# Patient Record
Sex: Female | Born: 1960
Health system: Southern US, Community
[De-identification: ages and names within clinical notes are randomized; demographics above are authoritative.]

## PROBLEM LIST (undated history)

## (undated) DIAGNOSIS — E079 Disorder of thyroid, unspecified: Secondary | ICD-10-CM

## (undated) DIAGNOSIS — K5792 Diverticulitis of intestine, part unspecified, without perforation or abscess without bleeding: Secondary | ICD-10-CM

## (undated) DIAGNOSIS — I10 Essential (primary) hypertension: Secondary | ICD-10-CM

## (undated) HISTORY — PX: APPENDECTOMY: SHX54

## (undated) HISTORY — PX: SMALL INTESTINE SURGERY: SHX150

## (undated) HISTORY — PX: NOSE SURGERY: SHX723

## (undated) HISTORY — PX: BREAST SURGERY: SHX581

## (undated) HISTORY — PX: TUBAL LIGATION: SHX77

---

## 2008-03-13 ENCOUNTER — Emergency Department (HOSPITAL_COMMUNITY): Admission: EM | Admit: 2008-03-13 | Discharge: 2008-03-13 | Payer: Self-pay | Admitting: Emergency Medicine

## 2008-12-30 ENCOUNTER — Emergency Department (HOSPITAL_COMMUNITY): Admission: EM | Admit: 2008-12-30 | Discharge: 2008-12-30 | Payer: Self-pay | Admitting: Emergency Medicine

## 2009-06-05 ENCOUNTER — Encounter: Admission: RE | Admit: 2009-06-05 | Discharge: 2009-06-05 | Payer: Self-pay | Admitting: Family Medicine

## 2009-10-27 ENCOUNTER — Emergency Department (HOSPITAL_COMMUNITY): Admission: EM | Admit: 2009-10-27 | Discharge: 2009-10-27 | Payer: Self-pay | Admitting: Family Medicine

## 2010-10-09 ENCOUNTER — Encounter: Admission: RE | Admit: 2010-10-09 | Discharge: 2010-10-09 | Payer: Self-pay | Admitting: Obstetrics and Gynecology

## 2011-03-25 LAB — URINALYSIS, ROUTINE W REFLEX MICROSCOPIC
Bilirubin Urine: NEGATIVE
Glucose, UA: NEGATIVE mg/dL
Protein, ur: NEGATIVE mg/dL
Urobilinogen, UA: 0.2 mg/dL (ref 0.0–1.0)

## 2011-03-25 LAB — URINE MICROSCOPIC-ADD ON

## 2011-08-19 ENCOUNTER — Emergency Department (HOSPITAL_COMMUNITY): Payer: BC Managed Care – PPO

## 2011-08-19 ENCOUNTER — Emergency Department (HOSPITAL_COMMUNITY)
Admission: EM | Admit: 2011-08-19 | Discharge: 2011-08-19 | Disposition: A | Discharge status: Home / Self Care | Payer: BC Managed Care – PPO | Attending: Emergency Medicine | Admitting: Emergency Medicine

## 2011-08-19 DIAGNOSIS — I10 Essential (primary) hypertension: Secondary | ICD-10-CM | POA: Insufficient documentation

## 2011-08-19 DIAGNOSIS — Z79899 Other long term (current) drug therapy: Secondary | ICD-10-CM | POA: Insufficient documentation

## 2011-08-19 DIAGNOSIS — E039 Hypothyroidism, unspecified: Secondary | ICD-10-CM | POA: Insufficient documentation

## 2011-08-19 DIAGNOSIS — R109 Unspecified abdominal pain: Secondary | ICD-10-CM | POA: Insufficient documentation

## 2011-08-19 LAB — URINALYSIS, ROUTINE W REFLEX MICROSCOPIC
Bilirubin Urine: NEGATIVE
Glucose, UA: NEGATIVE mg/dL
Leukocytes, UA: NEGATIVE
Nitrite: NEGATIVE
Specific Gravity, Urine: 1.007 (ref 1.005–1.030)
pH: 6.5 (ref 5.0–8.0)

## 2011-08-19 LAB — URINE MICROSCOPIC-ADD ON

## 2011-08-19 LAB — LIPASE, BLOOD: Lipase: 25 U/L (ref 11–59)

## 2011-09-02 ENCOUNTER — Other Ambulatory Visit: Payer: Self-pay | Admitting: Obstetrics and Gynecology

## 2011-09-02 DIAGNOSIS — Z1231 Encounter for screening mammogram for malignant neoplasm of breast: Secondary | ICD-10-CM

## 2011-09-03 LAB — CBC
HCT: 41.1
Hemoglobin: 14
MCHC: 34
MCV: 86.4
RDW: 13.3

## 2011-09-03 LAB — COMPREHENSIVE METABOLIC PANEL
Alkaline Phosphatase: 70
BUN: 17
Calcium: 9.6
Creatinine, Ser: 0.68
Glucose, Bld: 101 — ABNORMAL HIGH
Total Protein: 7.3

## 2011-09-03 LAB — DIFFERENTIAL
Basophils Relative: 0
Lymphocytes Relative: 19
Lymphs Abs: 1.9
Monocytes Relative: 6
Neutro Abs: 7.2
Neutrophils Relative %: 72

## 2011-09-03 LAB — URINALYSIS, ROUTINE W REFLEX MICROSCOPIC
Bilirubin Urine: NEGATIVE
Nitrite: NEGATIVE
Protein, ur: NEGATIVE
Urobilinogen, UA: 0.2

## 2011-10-11 ENCOUNTER — Ambulatory Visit
Admission: RE | Admit: 2011-10-11 | Discharge: 2011-10-11 | Disposition: A | Discharge status: Home / Self Care | Payer: BC Managed Care – PPO | Source: Ambulatory Visit | Attending: Obstetrics and Gynecology | Admitting: Obstetrics and Gynecology

## 2011-10-11 DIAGNOSIS — Z1231 Encounter for screening mammogram for malignant neoplasm of breast: Secondary | ICD-10-CM

## 2012-05-30 ENCOUNTER — Emergency Department (HOSPITAL_COMMUNITY)
Admission: EM | Admit: 2012-05-30 | Discharge: 2012-05-30 | Disposition: A | Discharge status: Home / Self Care | Payer: BC Managed Care – PPO | Attending: Emergency Medicine | Admitting: Emergency Medicine

## 2012-05-30 ENCOUNTER — Emergency Department (HOSPITAL_COMMUNITY): Payer: BC Managed Care – PPO

## 2012-05-30 ENCOUNTER — Encounter (HOSPITAL_COMMUNITY): Payer: Self-pay | Admitting: Emergency Medicine

## 2012-05-30 DIAGNOSIS — R61 Generalized hyperhidrosis: Secondary | ICD-10-CM | POA: Insufficient documentation

## 2012-05-30 DIAGNOSIS — K5792 Diverticulitis of intestine, part unspecified, without perforation or abscess without bleeding: Secondary | ICD-10-CM

## 2012-05-30 DIAGNOSIS — R110 Nausea: Secondary | ICD-10-CM | POA: Insufficient documentation

## 2012-05-30 DIAGNOSIS — R1032 Left lower quadrant pain: Secondary | ICD-10-CM | POA: Insufficient documentation

## 2012-05-30 DIAGNOSIS — E079 Disorder of thyroid, unspecified: Secondary | ICD-10-CM | POA: Insufficient documentation

## 2012-05-30 DIAGNOSIS — K5732 Diverticulitis of large intestine without perforation or abscess without bleeding: Secondary | ICD-10-CM | POA: Insufficient documentation

## 2012-05-30 DIAGNOSIS — Z79899 Other long term (current) drug therapy: Secondary | ICD-10-CM | POA: Insufficient documentation

## 2012-05-30 DIAGNOSIS — I10 Essential (primary) hypertension: Secondary | ICD-10-CM | POA: Insufficient documentation

## 2012-05-30 HISTORY — DX: Disorder of thyroid, unspecified: E07.9

## 2012-05-30 HISTORY — DX: Essential (primary) hypertension: I10

## 2012-05-30 HISTORY — DX: Diverticulitis of intestine, part unspecified, without perforation or abscess without bleeding: K57.92

## 2012-05-30 LAB — URINALYSIS, ROUTINE W REFLEX MICROSCOPIC
Bilirubin Urine: NEGATIVE
Glucose, UA: NEGATIVE mg/dL
Ketones, ur: NEGATIVE mg/dL
Leukocytes, UA: NEGATIVE
Nitrite: NEGATIVE
Protein, ur: NEGATIVE mg/dL
Specific Gravity, Urine: 1.007 (ref 1.005–1.030)
Urobilinogen, UA: 0.2 mg/dL (ref 0.0–1.0)
pH: 6 (ref 5.0–8.0)

## 2012-05-30 LAB — COMPREHENSIVE METABOLIC PANEL
ALT: 11 U/L (ref 0–35)
AST: 14 U/L (ref 0–37)
Albumin: 3.8 g/dL (ref 3.5–5.2)
Alkaline Phosphatase: 89 U/L (ref 39–117)
BUN: 24 mg/dL — ABNORMAL HIGH (ref 6–23)
CO2: 22 mEq/L (ref 19–32)
Calcium: 9.6 mg/dL (ref 8.4–10.5)
Chloride: 103 mEq/L (ref 96–112)
Creatinine, Ser: 0.72 mg/dL (ref 0.50–1.10)
GFR calc Af Amer: >90 mL/min (ref >90)
GFR calc non Af Amer: >90 mL/min (ref >90)
Glucose, Bld: 96 mg/dL (ref 70–99)
Potassium: 3.4 mEq/L — ABNORMAL LOW (ref 3.5–5.1)
Sodium: 138 mEq/L (ref 135–145)
Total Bilirubin: 0.4 mg/dL (ref 0.3–1.2)
Total Protein: 7.7 g/dL (ref 6.0–8.3)

## 2012-05-30 LAB — CBC
HCT: 40.6 % (ref 36.0–46.0)
Hemoglobin: 13.7 g/dL (ref 12.0–15.0)
MCH: 30 pg (ref 26.0–34.0)
MCHC: 33.7 g/dL (ref 30.0–36.0)
MCV: 88.8 fL (ref 78.0–100.0)
Platelets: 206 10*3/uL (ref 150–400)
RBC: 4.57 MIL/uL (ref 3.87–5.11)
RDW: 13.5 % (ref 11.5–15.5)
WBC: 12.2 10*3/uL — ABNORMAL HIGH (ref 4.0–10.5)

## 2012-05-30 LAB — DIFFERENTIAL
Basophils Absolute: 0.1 10*3/uL (ref 0.0–0.1)
Basophils Relative: 0 % (ref 0–1)
Eosinophils Absolute: 0.4 10*3/uL (ref 0.0–0.7)
Eosinophils Relative: 3 % (ref 0–5)
Lymphocytes Relative: 13 % (ref 12–46)
Lymphs Abs: 1.6 10*3/uL (ref 0.7–4.0)
Monocytes Absolute: 0.4 10*3/uL (ref 0.1–1.0)
Monocytes Relative: 3 % (ref 3–12)
Neutro Abs: 9.7 10*3/uL — ABNORMAL HIGH (ref 1.7–7.7)
Neutrophils Relative %: 80 % — ABNORMAL HIGH (ref 43–77)

## 2012-05-30 LAB — URINE MICROSCOPIC-ADD ON

## 2012-05-30 MED ORDER — OXYCODONE-ACETAMINOPHEN 5-325 MG PO TABS: 1.0000 | ORAL_TABLET | Freq: Four times a day (QID) | ORAL | Status: DC | PRN

## 2012-05-30 MED ORDER — PROMETHAZINE HCL 25 MG PO TABS: 25.0000 mg | ORAL_TABLET | Freq: Three times a day (TID) | ORAL | Status: DC | PRN

## 2012-05-30 MED ORDER — MOXIFLOXACIN HCL 400 MG PO TABS: 400.0000 mg | ORAL_TABLET | Freq: Every day | ORAL | Status: DC

## 2012-05-30 MED ORDER — MOXIFLOXACIN HCL IN NACL 400 MG/250ML IV SOLN
400.0000 mg | Freq: Once | INTRAVENOUS | Status: AC
  Administered 2012-05-30: 400 mg via INTRAVENOUS
  Filled 2012-05-30: qty 250

## 2012-05-30 MED ORDER — KETOROLAC TROMETHAMINE 30 MG/ML IJ SOLN
30.0000 mg | Freq: Once | INTRAMUSCULAR | Status: AC
  Administered 2012-05-30: 30 mg via INTRAVENOUS
  Filled 2012-05-30: qty 1

## 2012-05-30 NOTE — Discharge Instructions (Signed)
Return here to the emergency department for any worsening in your condition.  Followup with your primary care doctor Monday for recheck.

## 2012-05-30 NOTE — ED Notes (Signed)
Pt started on barium PO contrast- 1 cup @0905  2nd cup @1005 , scan planned for 1105

## 2012-05-30 NOTE — ED Notes (Signed)
Pt reports left lower quadrant abdominal pain onset Wednesday at 0300. Pt reports nausea, diaphoresis and pale stools.

## 2012-05-30 NOTE — ED Provider Notes (Signed)
Medical screening examination/treatment/procedure(s) were conducted as a shared visit with non-physician practitioner(s) and myself.  I personally evaluated the patient during the encounter 51 year old woman with prior history of diverticulitis has developed LLQ pain, has elevated WBC and CT of abdomen/pelvis showing diverticulitis.   Carleene Cooper III, MD 05/30/12 [phone]

## 2012-05-30 NOTE — ED Notes (Signed)
Pt remains in CT

## 2012-05-30 NOTE — ED Provider Notes (Signed)
History     CSN: [id number]  Arrival date & time 05/30/12  0736   First MD Initiated Contact with Patient 05/30/12 [phone]      Chief Complaint  Patient presents with  . Abdominal Pain    (Consider location/radiation/quality/duration/timing/severity/associated sxs/prior treatment) HPI Patient presents emergency department with left lower quadrant abdominal pain that started Wednesday.  Patient, states, that she has had a history of diverticulitis with one occasion where she had rupture of the abscess.  At that time she had bowel resection and repair.  Patient, states, that she has had nausea, and sweats, but no documented fever.  Patient, states she has not had any vomiting, diarrhea, chest pain, shortness of breath, weakness, dizziness, or back pain.  Patient, states that colonoscopy 3 months ago.  Patient, states that she took ibuprofen prior to arrival with some relief of her pain.  Past Medical History  Diagnosis Date  . Hypertension   . Thyroid disease   . Diverticulitis     Past Surgical History  Procedure Date  . Small intestine surgery   . Breast surgery   . Tubal ligation   . Nose surgery   . Appendectomy     History reviewed. No pertinent family history.  History  Substance Use Topics  . Smoking status: Never Smoker   . Smokeless tobacco: Not on file  . Alcohol Use: No    OB History    Grav Para Term Preterm Abortions TAB SAB Ect Mult Living                  Review of Systems All other systems negative except as documented in the HPI. All pertinent positives and negatives as reviewed in the HPI.  Allergies  Ivp dye  Home Medications   Current Outpatient Rx  Name Route Sig Dispense Refill  . IBUPROFEN 200 MG PO TABS Oral Take 400 mg by mouth every 6 (six) hours as needed. For pain    . KETOCON EX Apply externally Apply 1 application topically daily. For facial eczema    . LEVOTHYROXINE SODIUM 100 MCG PO TABS Oral Take 100 mcg by mouth daily.    Marland Kitchen  LOSARTAN POTASSIUM-HCTZ 50-12.5 MG PO TABS Oral Take 1 tablet by mouth daily.    . ADULT MULTIVITAMIN W/MINERALS CH Oral Take 1 tablet by mouth daily.    Marland Kitchen FISH OIL PO Oral Take 1 capsule by mouth daily.    . SULFACETAMIDE SOD-SULFUR WASH EX Apply externally Apply 1 application topically daily. For facial eczema      BP 114/73  Pulse 78  Temp 98 F (36.7 C) (Oral)  Ht 5\' 4"  (1.626 m)  Wt 165 lb (74.844 kg)  BMI 28.32 kg/m2  SpO2 95%  Physical Exam  Constitutional: She appears well-developed and well-nourished. No distress.  HENT:  Head: Normocephalic and atraumatic.  Mouth/Throat: Oropharynx is clear and moist.  Cardiovascular: Normal rate, regular rhythm and normal heart sounds.  Exam reveals no gallop and no friction rub.   No murmur heard. Pulmonary/Chest: Effort normal and breath sounds normal. No respiratory distress.  Abdominal: Soft. Bowel sounds are normal. She exhibits no distension. There is tenderness in the left lower quadrant. There is no rebound and no guarding.    Skin: Skin is warm and dry. No rash noted.    ED Course  Procedures (including critical care time)  Labs Reviewed  CBC - Abnormal; Notable for the following:    WBC 12.2 (*)  All other components within normal limits  DIFFERENTIAL - Abnormal; Notable for the following:    Neutrophils Relative 80 (*)     Neutro Abs 9.7 (*)     All other components within normal limits  COMPREHENSIVE METABOLIC PANEL - Abnormal; Notable for the following:    Potassium 3.4 (*)     BUN 24 (*)     All other components within normal limits  URINALYSIS, ROUTINE W REFLEX MICROSCOPIC - Abnormal; Notable for the following:    Hgb urine dipstick TRACE (*)     All other components within normal limits  URINE MICROSCOPIC-ADD ON   Ct Abdomen Pelvis Wo Contrast  05/30/2012  *RADIOLOGY REPORT*  Clinical Data: Abdominal pain.  CT ABDOMEN AND PELVIS WITHOUT CONTRAST  Technique:  Multidetector CT imaging of the abdomen and  pelvis was performed following the standard protocol without intravenous contrast.  Comparison: Abdominal CT 08/19/2011  Findings: The lung bases are clear.  There is no evidence for free intraperitoneal air.  Evaluation of the solid intra-abdominal organs is limited due the lack of intravenous contrast.  No gross abnormality to the liver, spleen, gallbladder, pancreas or adrenal glands.  There is a 3 mm stone in the right kidney upper pole without obstruction.  Normal appearance of both kidneys.  Evidence for tubal ligation clips bilaterally.  No gross abnormality to the uterus or adnexa tissue.  Focal wall thickening with surrounding inflammatory changes in the lower descending colon.  There are colonic diverticula in this region.  Findings are suspicious for acute diverticulitis.  There is a trace amount of fluid in this area but no evidence for a drainable fluid or abscess collection.  Mild amount of stool in the right colon.  Again noted are surgical clips in the lower abdomen and pelvis.  Degenerative disc disease in the lower lumbar spine. No acute bony abnormality.  IMPRESSION: Wall thickening and surrounding inflammatory changes in the lower left colon.  Findings are suggestive for acute diverticulitis. There is a trace amount of adjacent fluid but no evidence for an abscess collection.  Nonobstructive right kidney stone.  Original Report Authenticated By: Richarda Overlie, M.D.     Patient be treated for diverticulitis.  Patient, states she would like to go home on oral antibiotics and pain medications if possible.  Patient is, in stable here in the emergency department without any complicated factors.  Her vital signs remained stable as well.  Patient is given Avelox here in the emergency department for her diverticulitis.  Patient has not requested any pain medications because she is driving and does not have a ride.  Patient has been advised to the lab testing and CT scan result.  Recent voices an  understanding of our plan and is advised to return here for any worsening in her condition.  She is also advised to followup with her primary care doctor Monday. MDM          Carlyle Dolly, PA-C 05/30/12 1300

## 2012-06-08 ENCOUNTER — Emergency Department (HOSPITAL_COMMUNITY)
Admission: EM | Admit: 2012-06-08 | Discharge: 2012-06-09 | Disposition: A | Discharge status: Home / Self Care | Payer: BC Managed Care – PPO | Attending: Emergency Medicine | Admitting: Emergency Medicine

## 2012-06-08 ENCOUNTER — Encounter (HOSPITAL_COMMUNITY): Payer: Self-pay | Admitting: *Deleted

## 2012-06-08 ENCOUNTER — Emergency Department (HOSPITAL_COMMUNITY): Payer: BC Managed Care – PPO

## 2012-06-08 DIAGNOSIS — R6883 Chills (without fever): Secondary | ICD-10-CM | POA: Insufficient documentation

## 2012-06-08 DIAGNOSIS — M791 Myalgia, unspecified site: Secondary | ICD-10-CM

## 2012-06-08 DIAGNOSIS — R5383 Other fatigue: Secondary | ICD-10-CM | POA: Insufficient documentation

## 2012-06-08 DIAGNOSIS — B349 Viral infection, unspecified: Secondary | ICD-10-CM

## 2012-06-08 DIAGNOSIS — R5381 Other malaise: Secondary | ICD-10-CM | POA: Insufficient documentation

## 2012-06-08 DIAGNOSIS — I10 Essential (primary) hypertension: Secondary | ICD-10-CM | POA: Insufficient documentation

## 2012-06-08 DIAGNOSIS — R110 Nausea: Secondary | ICD-10-CM | POA: Insufficient documentation

## 2012-06-08 DIAGNOSIS — R109 Unspecified abdominal pain: Secondary | ICD-10-CM | POA: Insufficient documentation

## 2012-06-08 DIAGNOSIS — IMO0001 Reserved for inherently not codable concepts without codable children: Secondary | ICD-10-CM | POA: Insufficient documentation

## 2012-06-08 DIAGNOSIS — Z79899 Other long term (current) drug therapy: Secondary | ICD-10-CM | POA: Insufficient documentation

## 2012-06-08 DIAGNOSIS — E079 Disorder of thyroid, unspecified: Secondary | ICD-10-CM | POA: Insufficient documentation

## 2012-06-08 DIAGNOSIS — B9789 Other viral agents as the cause of diseases classified elsewhere: Secondary | ICD-10-CM | POA: Insufficient documentation

## 2012-06-08 LAB — COMPREHENSIVE METABOLIC PANEL
CO2: 27 mEq/L (ref 19–32)
Calcium: 10.1 mg/dL (ref 8.4–10.5)
Creatinine, Ser: 1.03 mg/dL (ref 0.50–1.10)
GFR calc Af Amer: 71 mL/min — ABNORMAL LOW (ref >90)
GFR calc non Af Amer: 61 mL/min — ABNORMAL LOW (ref >90)
Glucose, Bld: 110 mg/dL — ABNORMAL HIGH (ref 70–99)
Total Protein: 8.3 g/dL (ref 6.0–8.3)

## 2012-06-08 LAB — CBC WITH DIFFERENTIAL/PLATELET
Basophils Absolute: 0 10*3/uL (ref 0.0–0.1)
Eosinophils Absolute: 0.5 10*3/uL (ref 0.0–0.7)
Eosinophils Relative: 6 % — ABNORMAL HIGH (ref 0–5)
HCT: 42.6 % (ref 36.0–46.0)
Lymphocytes Relative: 9 % — ABNORMAL LOW (ref 12–46)
Lymphs Abs: 0.9 10*3/uL (ref 0.7–4.0)
MCH: 30.1 pg (ref 26.0–34.0)
MCV: 86.8 fL (ref 78.0–100.0)
Monocytes Absolute: 0.5 10*3/uL (ref 0.1–1.0)
RDW: 13.5 % (ref 11.5–15.5)
WBC: 9.4 10*3/uL (ref 4.0–10.5)

## 2012-06-08 LAB — URINE MICROSCOPIC-ADD ON

## 2012-06-08 LAB — URINALYSIS, ROUTINE W REFLEX MICROSCOPIC
Bilirubin Urine: NEGATIVE
Glucose, UA: NEGATIVE mg/dL
Ketones, ur: NEGATIVE mg/dL
Protein, ur: NEGATIVE mg/dL
Urobilinogen, UA: 0.2 mg/dL (ref 0.0–1.0)

## 2012-06-08 MED ORDER — MORPHINE SULFATE 4 MG/ML IJ SOLN
4.0000 mg | Freq: Once | INTRAMUSCULAR | Status: AC
  Administered 2012-06-08: 4 mg via INTRAVENOUS
  Filled 2012-06-08: qty 1

## 2012-06-08 MED ORDER — SODIUM CHLORIDE 0.9 % IV BOLUS (SEPSIS)
1000.0000 mL | Freq: Once | INTRAVENOUS | Status: AC
  Administered 2012-06-08: 1000 mL via INTRAVENOUS

## 2012-06-08 MED ORDER — ONDANSETRON HCL 4 MG/2ML IJ SOLN
4.0000 mg | Freq: Once | INTRAMUSCULAR | Status: AC
  Administered 2012-06-08: 4 mg via INTRAVENOUS
  Filled 2012-06-08: qty 2

## 2012-06-08 MED ORDER — KETOROLAC TROMETHAMINE 30 MG/ML IJ SOLN
30.0000 mg | Freq: Once | INTRAMUSCULAR | Status: AC
  Administered 2012-06-08: 30 mg via INTRAVENOUS
  Filled 2012-06-08: qty 1

## 2012-06-08 NOTE — ED Notes (Signed)
Rt flank pain since yesterday nausea no vomiting no diarrhea.  She was here a week ago and a kidney stone was found on c-t .  She was here for another reason

## 2012-06-08 NOTE — ED Provider Notes (Signed)
  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  I agree with the history, physical, assessment, and plan of care, with the following exceptions: None  I was present for the following procedures: None Time Spent in Critical Care of the patient: None Time spent in discussions with the patient and family: 10  Bari Leib Corlis Leak, MD 06/12/12 [phone]

## 2012-06-08 NOTE — ED Provider Notes (Signed)
History     CSN: [id number]  Arrival date & time 06/08/12  2122   First MD Initiated Contact with Patient 06/08/12 2209      Chief Complaint  Patient presents with  . Flank Pain    (Consider location/radiation/quality/duration/timing/severity/associated sxs/prior treatment) Patient is a 51 y.o. female presenting with flank pain. The history is provided by the patient and medical records.  Flank Pain This is a new problem. The current episode started today. The problem occurs constantly. The problem has been unchanged. Associated symptoms include chills, myalgias and nausea. Pertinent negatives include no abdominal pain, arthralgias, change in bowel habit, chest pain, congestion, coughing, fatigue, fever, headaches, rash, sore throat, swollen glands, vomiting or weakness. Nothing aggravates the symptoms. She has tried NSAIDs for the symptoms. The treatment provided mild relief.    Past Medical History  Diagnosis Date  . Hypertension   . Thyroid disease   . Diverticulitis     Past Surgical History  Procedure Date  . Small intestine surgery   . Breast surgery   . Tubal ligation   . Nose surgery   . Appendectomy     No family history on file.  History  Substance Use Topics  . Smoking status: Never Smoker   . Smokeless tobacco: Not on file  . Alcohol Use: No    OB History    Grav Para Term Preterm Abortions TAB SAB Ect Mult Living                  Review of Systems  Constitutional: Positive for chills. Negative for fever and fatigue.       Generalized malaise  HENT: Negative for congestion, sore throat and rhinorrhea.   Respiratory: Negative for cough and shortness of breath.   Cardiovascular: Negative for chest pain and palpitations.  Gastrointestinal: Positive for nausea. Negative for vomiting, abdominal pain, diarrhea, constipation and change in bowel habit.  Genitourinary: Positive for flank pain.  Musculoskeletal: Positive for myalgias. Negative for back pain  and arthralgias.  Skin: Negative for color change and rash.  Neurological: Negative for weakness, light-headedness and headaches.  All other systems reviewed and are negative.    Allergies  Ivp dye  Home Medications   Current Outpatient Rx  Name Route Sig Dispense Refill  . IBUPROFEN 200 MG PO TABS Oral Take 400 mg by mouth every 6 (six) hours as needed. For pain    . KETOCON EX Apply externally Apply 1 application topically daily. For facial eczema    . LEVOTHYROXINE SODIUM 100 MCG PO TABS Oral Take 100 mcg by mouth daily.    Marland Kitchen LOSARTAN POTASSIUM-HCTZ 50-12.5 MG PO TABS Oral Take 1 tablet by mouth daily.    Marland Kitchen MOXIFLOXACIN HCL 400 MG PO TABS Oral Take 400 mg by mouth daily.    . ADULT MULTIVITAMIN W/MINERALS CH Oral Take 1 tablet by mouth daily.    Marland Kitchen FISH OIL PO Oral Take 1 capsule by mouth daily.    . OXYCODONE-ACETAMINOPHEN 5-325 MG PO TABS Oral Take 1 tablet by mouth every 6 (six) hours as needed.    . SULFACETAMIDE SOD-SULFUR WASH EX Apply externally Apply 1 application topically daily. For facial eczema    . PROMETHAZINE HCL 25 MG PO TABS Oral Take 1 tablet (25 mg total) by mouth every 8 (eight) hours as needed for nausea. 15 tablet 0    BP 120/67  Pulse 102  Temp 98.4 F (36.9 C) (Oral)  Resp 20  SpO2 94%  Physical  Exam  Nursing note and vitals reviewed. Constitutional: She is oriented to person, place, and time. She appears well-developed and well-nourished.  HENT:  Head: Normocephalic and atraumatic.  Mouth/Throat: Oropharynx is clear and moist.  Eyes: Pupils are equal, round, and reactive to light.  Cardiovascular: Normal rate, regular rhythm, normal heart sounds and intact distal pulses.   Pulmonary/Chest: Effort normal and breath sounds normal. No respiratory distress.  Abdominal: Soft. She exhibits no distension. There is tenderness (mild, R flank).  Musculoskeletal: Normal range of motion. She exhibits tenderness (R side back muscles).  Lymphadenopathy:     She has no cervical adenopathy.  Neurological: She is alert and oriented to person, place, and time.  Skin: Skin is warm and dry.  Psychiatric: She has a normal mood and affect.    ED Course  Procedures (including critical care time)  Labs Reviewed  URINALYSIS, ROUTINE W REFLEX MICROSCOPIC - Abnormal; Notable for the following:    Hgb urine dipstick MODERATE (*)     All other components within normal limits  CBC WITH DIFFERENTIAL - Abnormal; Notable for the following:    Neutrophils Relative 80 (*)     Lymphocytes Relative 9 (*)     Eosinophils Relative 6 (*)     All other components within normal limits  COMPREHENSIVE METABOLIC PANEL - Abnormal; Notable for the following:    Glucose, Bld 110 (*)     GFR calc non Af Amer 61 (*)     GFR calc Af Amer 71 (*)     All other components within normal limits  LIPASE, BLOOD = 22  URINE MICROSCOPIC-ADD ON   Ct Abdomen Pelvis Wo Contrast  06/09/2012  *RADIOLOGY REPORT*  Clinical Data: Right flank pain  CT ABDOMEN AND PELVIS WITHOUT CONTRAST  Technique:  Multidetector CT imaging of the abdomen and pelvis was performed following the standard protocol without intravenous contrast.  Comparison: 05/30/2012  Findings: Limited images through the lung bases demonstrate no significant appreciable abnormality. The heart size is within normal limits. No pleural or pericardial effusion.  Organ abnormality/lesion detection is limited in the absence of intravenous contrast. Within this limitation, unremarkable liver, biliary system, spleen, pancreas, adrenal glands.  Unremarkable left kidney.  Nonobstructing upper pole right renal stone.  No hydronephrosis or hydroureter.  No ureteral calculi identified.  No bowel obstruction.  No CT evidence for colitis.  There is colonic diverticulosis.  No CT evidence for diverticulitis. Appendix not identified.  No right lower quadrant inflammation.  Thin-walled bladder.  Nonspecific CT appearance to the uterus and adnexa.   Tubal ligation clips are present.  Mild scattered atherosclerotic disease of the aorta and branch vessels.  No acute osseous finding.  L4 bone island.  L4-5 degenerative disc disease.  IMPRESSION: Nonobstructing right renal stone.  No hydronephrosis or hydroureter.  Colonic diverticulosis.  Interval resolution of the diverticulitis.  Original Report Authenticated By: Waneta Martins, M.D.     1. Viral syndrome   2. Myalgia       MDM  51 yo F presents with onset of generalized malaise and diffuse myalgias today. Says pain is worst in R side of back and R flank, worsening of her baseline reflux, no colicky pain, has had nausea but no vomiting, no changes in bowels, no urinary symptoms. Says has had chills, but T max at home = 100; no cough/congestion, chest pain, SOB, focal infectious symptoms. On exam, has mild tenderness along musculature of R side of pain, moderate R flank pain, no  other focal tenderness, no signs of infection on exam. Was recently treated for diverticulitis, and says all symptoms from that have resolved. Will check basic labs, including urine, to evaluate for possible etiology of symptoms.  Pt with moderate blood on UA; no signs of infection. Was noted to have renal pole stone when seen here for diverticulitis recently; will get CT scan to evaluate for possible stone contributing to symptoms.   CT shows similar stone as from last CT, but no other acute findings. Discussed findings with pt, symptomatic treatment at home, f/u with PCP, and indications for return, and pt expresses understanding.          Theotis Burrow, MD 06/09/12 [phone]

## 2012-06-08 NOTE — ED Notes (Addendum)
Pt. Reports pain " everywhere feels like all my muscles are tight and inflamed.  It hurts most in right flank, RUQ, and right shoulder 8/10". Non tender on palpation. Reports having gallbladder removed. States pain started yesterday and continued to progress today. Reports nausea x 1 day with no vomiting. Reports "waves of hot and cold. Temp at home was 100F".  Denies  Diarrhea, headache, chest pain, SOB.  A.O . X 4.

## 2012-06-09 MED ORDER — ONDANSETRON HCL 4 MG PO TABS: 4.0000 mg | ORAL_TABLET | Freq: Four times a day (QID) | ORAL | Status: AC | PRN

## 2012-06-09 MED ORDER — GI COCKTAIL ~~LOC~~
30.0000 mL | Freq: Once | ORAL | Status: AC
  Administered 2012-06-09: 30 mL via ORAL
  Filled 2012-06-09: qty 30

## 2012-06-09 NOTE — Discharge Instructions (Signed)
Make sure you drink plenty of fluids. Continue to take ibuprofen (Motrin) or acetaminophen (Tylenol) as needed for aches and pains. Return if you have worsening or changing symptoms, or for any other concerns.   Viral Syndrome You or your child has Viral Syndrome. It is the most common infection causing "colds" and infections in the nose, throat, sinuses, and breathing tubes. Sometimes the infection causes nausea, vomiting, or diarrhea. The germ that causes the infection is a virus. No antibiotic or other medicine will kill it. There are medicines that you can take to make you or your child more comfortable.  HOME CARE INSTRUCTIONS   Rest in bed until you start to feel better.   If you have diarrhea or vomiting, eat small amounts of crackers and toast. Soup is helpful.   Do not give aspirin or medicine that contains aspirin to children.   Only take over-the-counter or prescription medicines for pain, discomfort, or fever as directed by your caregiver.  SEEK IMMEDIATE MEDICAL CARE IF:   You or your child has not improved within one week.   You or your child has pain that is not at least partially relieved by over-the-counter medicine.   Thick, colored mucus or blood is coughed up.   Discharge from the nose becomes thick yellow or green.   Diarrhea or vomiting gets worse.   There is any major change in your or your child's condition.   You or your child develops a skin rash, stiff neck, severe headache, or are unable to hold down food or fluid.   You or your child has an oral temperature above 102 F (38.9 C), not controlled by medicine.   Your baby is older than 3 months with a rectal temperature of 102 F (38.9 C) or higher.   Your baby is 52 months old or younger with a rectal temperature of 100.4 F (38 C) or higher.  Document Released: 11/10/2006 Document Revised: 11/14/2011 Document Reviewed: 11/11/2007 [facility] Patient Information 2012 [place], [place].  Myalgia,  Adult Myalgia is the medical term for muscle pain. It is a symptom of many things. Nearly everyone at some time in their life has this. The most common cause for muscle pain is overuse or straining and more so when you are not in shape. Injuries and muscle bruises cause myalgias. Muscle pain without a history of injury can also be caused by a virus. It frequently comes along with the flu. Myalgia not caused by muscle strain can be present in a large number of infectious diseases. Some autoimmune diseases like lupus and fibromyalgia can cause muscle pain. Myalgia may be mild, or severe. SYMPTOMS  The symptoms of myalgia are simply muscle pain. Most of the time this is short lived and the pain goes away without treatment. DIAGNOSIS  Myalgia is diagnosed by your caregiver by taking your history. This means you tell him when the problems began, what they are, and what has been happening. If this has not been a long term problem, your caregiver may want to watch for a while to see what will happen. If it has been long term, they may want to do additional testing. TREATMENT  The treatment depends on what the underlying cause of the muscle pain is. Often anti-inflammatory medications will help. HOME CARE INSTRUCTIONS  If the pain in your muscles came from overuse, slow down your activities until the problems go away.   Myalgia from overuse of a muscle can be treated with alternating hot and cold  packs on the muscle affected or with cold for the first couple days. If either heat or cold seems to make things worse, stop their use.   Apply ice to the sore area for 15 to 20 minutes, 3 to 4 times per day, while awake for the first 2 days of muscle soreness, or as directed. Put the ice in a plastic bag and place a towel between the bag of ice and your skin.   Only take over-the-counter or prescription medicines for pain, discomfort, or fever as directed by your caregiver.   Regular gentle exercise may help if  you are not active.   Stretching before strenuous exercise can help lower the risk of myalgia. It is normal when beginning an exercise regimen to feel some muscle pain after exercising. Muscles that have not been used frequently will be sore at first. If the pain is extreme, this may mean injury to a muscle.  SEEK MEDICAL CARE IF:  You have an increase in muscle pain that is not relieved with medication.   You begin to run a temperature.   You develop nausea and vomiting.   You develop a stiff and painful neck.   You develop a rash.   You develop muscle pain after a tick bite.   You have continued muscle pain while working out even after you are in good condition.  SEEK IMMEDIATE MEDICAL CARE IF: Any of your problems are getting worse and medications are not helping. MAKE SURE YOU:   Understand these instructions.   Will watch your condition.   Will get help right away if you are not doing well or get worse.  Document Released: 10/17/2006 Document Revised: 11/14/2011 Document Reviewed: 01/06/2007 [facility] Patient Information 2012 [place], [place].

## 2012-06-11 NOTE — ED Provider Notes (Signed)
See my note  Hilario Quarry, MD 06/11/12 [phone]

## 2013-04-09 ENCOUNTER — Other Ambulatory Visit: Payer: Self-pay | Admitting: Nurse Practitioner

## 2013-04-12 NOTE — Telephone Encounter (Signed)
No chart in file.  Not in our previous system.  Not our patient.  Denying refill.

## 2015-01-09 ENCOUNTER — Other Ambulatory Visit: Payer: Self-pay | Admitting: Family Medicine

## 2015-01-09 ENCOUNTER — Ambulatory Visit
Admission: RE | Admit: 2015-01-09 | Discharge: 2015-01-09 | Disposition: A | Discharge status: Home / Self Care | Payer: BLUE CROSS/BLUE SHIELD | Source: Ambulatory Visit | Attending: Family Medicine | Admitting: Family Medicine

## 2015-01-09 DIAGNOSIS — M25512 Pain in left shoulder: Secondary | ICD-10-CM

## 2015-08-05 ENCOUNTER — Encounter (HOSPITAL_COMMUNITY): Payer: Self-pay | Admitting: Emergency Medicine

## 2015-08-05 ENCOUNTER — Emergency Department (HOSPITAL_COMMUNITY)
Admission: EM | Admit: 2015-08-05 | Discharge: 2015-08-05 | Disposition: A | Discharge status: Home / Self Care | Payer: BC Managed Care – PPO | Attending: Emergency Medicine | Admitting: Emergency Medicine

## 2015-08-05 ENCOUNTER — Emergency Department (HOSPITAL_COMMUNITY): Payer: BC Managed Care – PPO

## 2015-08-05 DIAGNOSIS — I10 Essential (primary) hypertension: Secondary | ICD-10-CM | POA: Diagnosis not present

## 2015-08-05 DIAGNOSIS — K219 Gastro-esophageal reflux disease without esophagitis: Secondary | ICD-10-CM | POA: Diagnosis not present

## 2015-08-05 DIAGNOSIS — E039 Hypothyroidism, unspecified: Secondary | ICD-10-CM | POA: Diagnosis not present

## 2015-08-05 DIAGNOSIS — Z79899 Other long term (current) drug therapy: Secondary | ICD-10-CM | POA: Diagnosis not present

## 2015-08-05 DIAGNOSIS — Z9049 Acquired absence of other specified parts of digestive tract: Secondary | ICD-10-CM | POA: Insufficient documentation

## 2015-08-05 DIAGNOSIS — R1011 Right upper quadrant pain: Secondary | ICD-10-CM | POA: Diagnosis present

## 2015-08-05 LAB — COMPREHENSIVE METABOLIC PANEL
ALT: 17 U/L (ref 14–54)
ANION GAP: 6 (ref 5–15)
AST: 18 U/L (ref 15–41)
Albumin: 3.8 g/dL (ref 3.5–5.0)
Alkaline Phosphatase: 67 U/L (ref 38–126)
BUN: 13 mg/dL (ref 6–20)
CALCIUM: 9.4 mg/dL (ref 8.9–10.3)
CHLORIDE: 106 mmol/L (ref 101–111)
CO2: 28 mmol/L (ref 22–32)
Creatinine, Ser: 0.76 mg/dL (ref 0.44–1.00)
Glucose, Bld: 90 mg/dL (ref 65–99)
Potassium: 3.5 mmol/L (ref 3.5–5.1)
SODIUM: 140 mmol/L (ref 135–145)
Total Bilirubin: 0.6 mg/dL (ref 0.3–1.2)
Total Protein: 6.9 g/dL (ref 6.5–8.1)

## 2015-08-05 LAB — CBC WITH DIFFERENTIAL/PLATELET
BASOS ABS: 0 10*3/uL (ref 0.0–0.1)
Basophils Relative: 0 % (ref 0–1)
EOS ABS: 0.2 10*3/uL (ref 0.0–0.7)
Eosinophils Relative: 3 % (ref 0–5)
HCT: 40.1 % (ref 36.0–46.0)
HEMOGLOBIN: 13.5 g/dL (ref 12.0–15.0)
LYMPHS ABS: 1.8 10*3/uL (ref 0.7–4.0)
LYMPHS PCT: 25 % (ref 12–46)
MCH: 29.7 pg (ref 26.0–34.0)
MCHC: 33.7 g/dL (ref 30.0–36.0)
MCV: 88.1 fL (ref 78.0–100.0)
Monocytes Absolute: 0.3 10*3/uL (ref 0.1–1.0)
Monocytes Relative: 4 % (ref 3–12)
NEUTROS PCT: 68 % (ref 43–77)
Neutro Abs: 4.9 10*3/uL (ref 1.7–7.7)
PLATELETS: 217 10*3/uL (ref 150–400)
RBC: 4.55 MIL/uL (ref 3.87–5.11)
RDW: 13.6 % (ref 11.5–15.5)
WBC: 7.2 10*3/uL (ref 4.0–10.5)

## 2015-08-05 LAB — LIPASE, BLOOD: LIPASE: 23 U/L (ref 22–51)

## 2015-08-05 MED ORDER — POTASSIUM CHLORIDE 10 MEQ/100ML IV SOLN: 10.0000 meq | INTRAVENOUS | Status: DC

## 2015-08-05 MED ORDER — OMEPRAZOLE 20 MG PO CPDR: 20.0000 mg | DELAYED_RELEASE_CAPSULE | Freq: Two times a day (BID) | ORAL | Status: DC

## 2015-08-05 MED ORDER — GI COCKTAIL ~~LOC~~
30.0000 mL | Freq: Once | ORAL | Status: AC
  Administered 2015-08-05: 30 mL via ORAL
  Filled 2015-08-05: qty 30

## 2015-08-05 NOTE — ED Notes (Signed)
Patient transported to Ultrasound 

## 2015-08-05 NOTE — ED Notes (Signed)
Pt. Stated, I started having abdominal bloating with stomach pain on the right lower side.  i think Im having a gallbladder problem.  I've had problems before.

## 2015-08-05 NOTE — ED Provider Notes (Signed)
CSN: [id number]     Arrival date & time 08/05/15  3086 History   First MD Initiated Contact with Patient 08/05/15 [phone]     Chief Complaint  Patient presents with  . Bloated  . Abdominal Pain   Kaylee Carter is a 54 y.o. female with a history of HTN, Hypothyroid, and diverticulitis who presents to the ED complaining of right upper quadrant abdominal pain and bloating for the past 5 days. Patient reports lots of symptoms of acid reflux recently as well as pain in her right upper quadrant which she rates at a 5 out of 10 currently. Patient reports lots of feelings of indigestion and bloating. Patient reports that she has worsening symptoms after eating. Patient reports starting to take omeprazole and ranitidine this past week. She denies any current nausea however she had some earlier this week. She denies any vomiting. She has any changes to her bowel habits. Patient has had a previous appendectomy and small bowel resection after a small bowel perforation back in 2006 due to diverticulitis. Patient denies fevers, chills, nausea, vomiting, hematemesis, hematochezia, urinary symptoms, diarrhea, chest pain, shortness of breath or cough.  (Consider location/radiation/quality/duration/timing/severity/associated sxs/prior Treatment) HPI  Past Medical History  Diagnosis Date  . Hypertension   . Thyroid disease   . Diverticulitis    Past Surgical History  Procedure Laterality Date  . Small intestine surgery    . Breast surgery    . Tubal ligation    . Nose surgery    . Appendectomy     No family history on file. Social History  Substance Use Topics  . Smoking status: Never Smoker   . Smokeless tobacco: None  . Alcohol Use: No   OB History    No data available     Review of Systems  Constitutional: Negative for fever and chills.  HENT: Negative for congestion and sore throat.   Eyes: Negative for visual disturbance.  Respiratory: Negative for cough, shortness of breath and wheezing.    Cardiovascular: Negative for chest pain and palpitations.  Gastrointestinal: Positive for nausea and abdominal pain. Negative for vomiting, diarrhea and blood in stool.  Genitourinary: Negative for dysuria, urgency, frequency, flank pain and difficulty urinating.  Musculoskeletal: Negative for back pain and neck pain.  Skin: Negative for rash.  Neurological: Negative for headaches.      Allergies  Ivp dye  Home Medications   Prior to Admission medications   Medication Sig Start Date End Date Taking? Authorizing Provider  ibuprofen (ADVIL,MOTRIN) 200 MG tablet Take 400 mg by mouth every 6 (six) hours as needed. For pain    Historical Provider, MD  Ketoconazole-Hydrocortisone (KETOCON EX) Apply 1 application topically daily. For facial eczema    Historical Provider, MD  levothyroxine (SYNTHROID, LEVOTHROID) 100 MCG tablet Take 100 mcg by mouth daily.    Historical Provider, MD  losartan-hydrochlorothiazide (HYZAAR) 50-12.5 MG per tablet Take 1 tablet by mouth daily.    Historical Provider, MD  Multiple Vitamin (MULTIVITAMIN WITH MINERALS) TABS Take 1 tablet by mouth daily.    Historical Provider, MD  Omega-3 Fatty Acids (FISH OIL PO) Take 1 capsule by mouth daily.    Historical Provider, MD  omeprazole (PRILOSEC) 20 MG capsule Take 1 capsule (20 mg total) by mouth 2 (two) times daily before a meal. 08/05/15   Everlene Farrier, PA-C  promethazine (PHENERGAN) 25 MG tablet Take 1 tablet (25 mg total) by mouth every 8 (eight) hours as needed for nausea. 05/30/12 06/06/12  Charlestine Night,  PA-C  Sulfacetamide Sodium-Sulfur (SULFACETAMIDE SOD-SULFUR WASH EX) Apply 1 application topically daily. For facial eczema    Historical Provider, MD   BP 141/80 mmHg  Pulse 68  Temp(Src) 98.4 F (36.9 C) (Oral)  Resp 20  Wt 165 lb (74.844 kg)  SpO2 96% Physical Exam  Constitutional: She appears well-developed and well-nourished. No distress.  Nontoxic appearing.  HENT:  Head: Normocephalic and  atraumatic.  Mouth/Throat: Oropharynx is clear and moist.  Eyes: Conjunctivae are normal. Pupils are equal, round, and reactive to light. Right eye exhibits no discharge. Left eye exhibits no discharge.  Neck: Neck supple.  Cardiovascular: Normal rate, regular rhythm, normal heart sounds and intact distal pulses.  Exam reveals no gallop and no friction rub.   No murmur heard. Pulmonary/Chest: Effort normal and breath sounds normal. No respiratory distress. She has no wheezes. She has no rales.  Abdominal: Soft. Bowel sounds are normal. She exhibits no distension and no mass. There is tenderness. There is no rebound and no guarding.  Abdomen is soft. Bowel sounds are present. Patient has mild right upper quadrant tenderness to palpation. No lower abdominal tenderness to palpation. No peritoneal signs. Negative psoas and obturator sign.  Musculoskeletal: She exhibits no edema or tenderness.  Lymphadenopathy:    She has no cervical adenopathy.  Neurological: She is alert. Coordination normal.  Skin: Skin is warm and dry. No rash noted. She is not diaphoretic. No erythema. No pallor.  Psychiatric: She has a normal mood and affect. Her behavior is normal.  Nursing note and vitals reviewed.   ED Course  Procedures (including critical care time) Labs Review Labs Reviewed  COMPREHENSIVE METABOLIC PANEL  LIPASE, BLOOD  CBC WITH DIFFERENTIAL/PLATELET    Imaging Review US Abdomen Complete  08/05/2015   CLINICAL DATA:  Abdominal pain for the last 4 days. History of hypertension.  EXAM: ULTRASOUND ABDOMEN COMPLETE  COMPARISON:  CT, 06/08/2012  FINDINGS: Gallbladder: No gallstones or wall thickening visualized. No sonographic Murphy sign noted.  Common bile duct: Diameter: 4 mm  Liver: Increased parenchymal echogenicity. No mass or focal lesion. Hepatopetal flow documented in the portal vein.  IVC: No abnormality visualized.  Pancreas: Visualized portion unremarkable.  Spleen: Size and appearance  within normal limits.  Right Kidney: Length: 10.7 cm. Echogenicity within normal limits. No mass or hydronephrosis visualized.  Left Kidney: Length: 10.4 cm. Echogenicity within normal limits. No mass or hydronephrosis visualized.  Abdominal aorta: No aneurysm visualized.  Other findings: None.  IMPRESSION: 1. No acute findings.  Normal gallbladder.  No bile duct dilation. 2. Hepatic steatosis.  No other abnormalities.   Electronically Signed   By: Amie Portland M.D.   On: 08/05/2015 12:39   I have personally reviewed and evaluated these images and lab results as part of my medical decision-making.   EKG Interpretation None      Filed Vitals:   08/05/15 0940  BP: 141/80  Pulse: 68  Temp: 98.4 F (36.9 C)  TempSrc: Oral  Resp: 20  Weight: 165 lb (74.844 kg)  SpO2: 96%     MDM   Meds given in ED:  Medications  gi cocktail (Maalox,Lidocaine,Donnatal) (30 mLs Oral Given 08/05/15 1119)    New Prescriptions   OMEPRAZOLE (PRILOSEC) 20 MG CAPSULE    Take 1 capsule (20 mg total) by mouth 2 (two) times daily before a meal.    Final diagnoses:  Gastroesophageal reflux disease, esophagitis presence not specified  Right upper quadrant abdominal pain   This  is  a 54 y.o. female with a history of HTN, Hypothyroid, and diverticulitis who presents to the ED complaining of right upper quadrant abdominal pain and bloating for the past 5 days. Patient reports lots of symptoms of acid reflux recently as well as pain in her right upper quadrant which she rates at a 5 out of 10 currently. Patient reports lots of feelings of indigestion and bloating. Patient reports that she has worsening symptoms after eating. Patient reports starting to take omeprazole and ranitidine this past week. She denies any current nausea however she had some earlier this week. She denies any vomiting. She denies any changes to her bowel habits. On exam the patient is afebrile and nontoxic appearing. Patient does have right upper  quadrant abdominal tenderness to palpation. No Murphy sign. No peritoneal signs. CMP, lipase and CBC are all within normal limits. Abdominal ultrasound shows no acute findings and a normal gallbladder. Patient received a GI cocktail in the emergency department and reports that her symptoms have improved. Patient's symptoms are likely related to reflux or possible gastric ulcer. This patient has just started taking omeprazole and encouraged her to continue using this medication and to increase to twice a day. I also encouraged patient to stop taking ibuprofen which she has been taking for pain. Patient education also provided on food choices for acid reflux. Encouraged patient to follow-up with her primary care provider next week to ensure symptom improvement. I advised the patient to follow-up with their primary care provider this week. I advised the patient to return to the emergency department with new or worsening symptoms or new concerns. The patient verbalized understanding and agreement with plan.    This patient was discussed with Dr. Fredderick Phenix who agrees with assessment and plan.   Everlene Farrier, PA-C 08/05/15 1308  Rolan Bucco, MD 08/06/15 [phone]

## 2015-08-05 NOTE — Discharge Instructions (Signed)
Food Choices for Gastroesophageal Reflux Disease When you have gastroesophageal reflux disease (GERD), the foods you eat and your eating habits are very important. Choosing the right foods can help ease the discomfort of GERD. WHAT GENERAL GUIDELINES DO I NEED TO FOLLOW?  Choose fruits, vegetables, whole grains, low-fat dairy products, and low-fat meat, fish, and poultry.  Limit fats such as oils, salad dressings, butter, nuts, and avocado.  Keep a food diary to identify foods that cause symptoms.  Avoid foods that cause reflux. These may be different for different people.  Eat frequent small meals instead of three large meals each day.  Eat your meals slowly, in a relaxed setting.  Limit fried foods.  Cook foods using methods other than frying.  Avoid drinking alcohol.  Avoid drinking large amounts of liquids with your meals.  Avoid bending over or lying down until 2-3 hours after eating. WHAT FOODS ARE NOT RECOMMENDED? The following are some foods and drinks that may worsen your symptoms: Vegetables Tomatoes. Tomato juice. Tomato and spaghetti sauce. Chili peppers. Onion and garlic. Horseradish. Fruits Oranges, grapefruit, and lemon (fruit and juice). Meats High-fat meats, fish, and poultry. This includes hot dogs, ribs, ham, sausage, salami, and bacon. Dairy Whole milk and chocolate milk. Sour cream. Cream. Butter. Ice cream. Cream cheese.  Beverages Coffee and tea, with or without caffeine. Carbonated beverages or energy drinks. Condiments Hot sauce. Barbecue sauce.  Sweets/Desserts Chocolate and cocoa. Donuts. Peppermint and spearmint. Fats and Oils High-fat foods, including [place] fries and potato chips. Other Vinegar. Strong spices, such as black pepper, white pepper, red pepper, cayenne, curry powder, cloves, ginger, and chili powder. The items listed above may not be a complete list of foods and beverages to avoid. Contact your dietitian for more  information. Document Released: 11/25/2005 Document Revised: 11/30/2013 Document Reviewed: 09/29/2013 [facility] Patient Information 2015 [place], [place]. This information is not intended to replace advice given to you by your health care provider. Make sure you discuss any questions you have with your health care provider. Gastroesophageal Reflux Disease, Adult Gastroesophageal reflux disease (GERD) happens when acid from your stomach flows up into the esophagus. When acid comes in contact with the esophagus, the acid causes soreness (inflammation) in the esophagus. Over time, GERD may create small holes (ulcers) in the lining of the esophagus. CAUSES   Increased body weight. This puts pressure on the stomach, making acid rise from the stomach into the esophagus.  Smoking. This increases acid production in the stomach.  Drinking alcohol. This causes decreased pressure in the lower esophageal sphincter (valve or ring of muscle between the esophagus and stomach), allowing acid from the stomach into the esophagus.  Late evening meals and a full stomach. This increases pressure and acid production in the stomach.  A malformed lower esophageal sphincter. Sometimes, no cause is found. SYMPTOMS   Burning pain in the lower part of the mid-chest behind the breastbone and in the mid-stomach area. This may occur twice a week or more often.  Trouble swallowing.  Sore throat.  Dry cough.  Asthma-like symptoms including chest tightness, shortness of breath, or wheezing. DIAGNOSIS  Your caregiver may be able to diagnose GERD based on your symptoms. In some cases, X-rays and other tests may be done to check for complications or to check the condition of your stomach and esophagus. TREATMENT  Your caregiver may recommend over-the-counter or prescription medicines to help decrease acid production. Ask your caregiver before starting or adding any new medicines.  HOME CARE  INSTRUCTIONS   Change the  factors that you can control. Ask your caregiver for guidance concerning weight loss, quitting smoking, and alcohol consumption.  Avoid foods and drinks that make your symptoms worse, such as:  Caffeine or alcoholic drinks.  Chocolate.  Peppermint or mint flavorings.  Garlic and onions.  Spicy foods.  Citrus fruits, such as oranges, lemons, or limes.  Tomato-based foods such as sauce, chili, salsa, and pizza.  Fried and fatty foods.  Avoid lying down for the 3 hours prior to your bedtime or prior to taking a nap.  Eat small, frequent meals instead of large meals.  Wear loose-fitting clothing. Do not wear anything tight around your waist that causes pressure on your stomach.  Raise the head of your bed 6 to 8 inches with wood blocks to help you sleep. Extra pillows will not help.  Only take over-the-counter or prescription medicines for pain, discomfort, or fever as directed by your caregiver.  Do not take aspirin, ibuprofen, or other nonsteroidal anti-inflammatory drugs (NSAIDs). SEEK IMMEDIATE MEDICAL CARE IF:   You have pain in your arms, neck, jaw, teeth, or back.  Your pain increases or changes in intensity or duration.  You develop nausea, vomiting, or sweating (diaphoresis).  You develop shortness of breath, or you faint.  Your vomit is green, yellow, black, or looks like coffee grounds or blood.  Your stool is red, bloody, or black. These symptoms could be signs of other problems, such as heart disease, gastric bleeding, or esophageal bleeding. MAKE SURE YOU:   Understand these instructions.  Will watch your condition.  Will get help right away if you are not doing well or get worse. Document Released: 09/04/2005 Document Revised: 02/17/2012 Document Reviewed: 06/14/2011 [facility] Patient Information 2015 [place], [place]. This information is not intended to replace advice given to you by your health care provider. Make sure you discuss any questions you have  with your health care provider.  Abdominal Pain, Women Abdominal (stomach, pelvic, or belly) pain can be caused by many things. It is important to tell your doctor:  The location of the pain.  Does it come and go or is it present all the time?  Are there things that start the pain (eating certain foods, exercise)?  Are there other symptoms associated with the pain (fever, nausea, vomiting, diarrhea)? All of this is helpful to know when trying to find the cause of the pain. CAUSES   Stomach: virus or bacteria infection, or ulcer.  Intestine: appendicitis (inflamed appendix), regional ileitis (Crohn's disease), ulcerative colitis (inflamed colon), irritable bowel syndrome, diverticulitis (inflamed diverticulum of the colon), or cancer of the stomach or intestine.  Gallbladder disease or stones in the gallbladder.  Kidney disease, kidney stones, or infection.  Pancreas infection or cancer.  Fibromyalgia (pain disorder).  Diseases of the female organs:  Uterus: fibroid (non-cancerous) tumors or infection.  Fallopian tubes: infection or tubal pregnancy.  Ovary: cysts or tumors.  Pelvic adhesions (scar tissue).  Endometriosis (uterus lining tissue growing in the pelvis and on the pelvic organs).  Pelvic congestion syndrome (female organs filling up with blood just before the menstrual period).  Pain with the menstrual period.  Pain with ovulation (producing an egg).  Pain with an IUD (intrauterine device, birth control) in the uterus.  Cancer of the female organs.  Functional pain (pain not caused by a disease, may improve without treatment).  Psychological pain.  Depression. DIAGNOSIS  Your doctor will decide the seriousness of your pain by doing  an examination.  Blood tests.  X-rays.  Ultrasound.  CT scan (computed tomography, special type of X-ray).  MRI (magnetic resonance imaging).  Cultures, for infection.  Barium enema (dye inserted in the large  intestine, to better view it with X-rays).  Colonoscopy (looking in intestine with a lighted tube).  Laparoscopy (minor surgery, looking in abdomen with a lighted tube).  Major abdominal exploratory surgery (looking in abdomen with a large incision). TREATMENT  The treatment will depend on the cause of the pain.   Many cases can be observed and treated at home.  Over-the-counter medicines recommended by your caregiver.  Prescription medicine.  Antibiotics, for infection.  Birth control pills, for painful periods or for ovulation pain.  Hormone treatment, for endometriosis.  Nerve blocking injections.  Physical therapy.  Antidepressants.  Counseling with a psychologist or psychiatrist.  Minor or major surgery. HOME CARE INSTRUCTIONS   Do not take laxatives, unless directed by your caregiver.  Take over-the-counter pain medicine only if ordered by your caregiver. Do not take aspirin because it can cause an upset stomach or bleeding.  Try a clear liquid diet (broth or water) as ordered by your caregiver. Slowly move to a bland diet, as tolerated, if the pain is related to the stomach or intestine.  Have a thermometer and take your temperature several times a day, and record it.  Bed rest and sleep, if it helps the pain.  Avoid sexual intercourse, if it causes pain.  Avoid stressful situations.  Keep your follow-up appointments and tests, as your caregiver orders.  If the pain does not go away with medicine or surgery, you may try:  Acupuncture.  Relaxation exercises (yoga, meditation).  Group therapy.  Counseling. SEEK MEDICAL CARE IF:   You notice certain foods cause stomach pain.  Your home care treatment is not helping your pain.  You need stronger pain medicine.  You want your IUD removed.  You feel faint or lightheaded.  You develop nausea and vomiting.  You develop a rash.  You are having side effects or an allergy to your medicine. SEEK  IMMEDIATE MEDICAL CARE IF:   Your pain does not go away or gets worse.  You have a fever.  Your pain is felt only in portions of the abdomen. The right side could possibly be appendicitis. The left lower portion of the abdomen could be colitis or diverticulitis.  You are passing blood in your stools (bright red or black tarry stools, with or without vomiting).  You have blood in your urine.  You develop chills, with or without a fever.  You pass out. MAKE SURE YOU:   Understand these instructions.  Will watch your condition.  Will get help right away if you are not doing well or get worse. Document Released: 09/22/2007 Document Revised: 04/11/2014 Document Reviewed: 10/12/2009 [facility] Patient Information 2015 [place], [place]. This information is not intended to replace advice given to you by your health care provider. Make sure you discuss any questions you have with your health care provider.

## 2015-08-05 NOTE — ED Notes (Signed)
Vital signs stable. 

## 2015-09-13 IMAGING — US US ABDOMEN COMPLETE
1 series · 14 of 25 positions shown · non-contrast
Comparison: CT, 06/08/2012

CLINICAL DATA: Abdominal pain for the last 4 days. History of
hypertension.

EXAM:
ULTRASOUND ABDOMEN COMPLETE

[Series 1: us abdomen complete · 0.22mm/px · 14 of 75 slices shown]
[im 1/75]
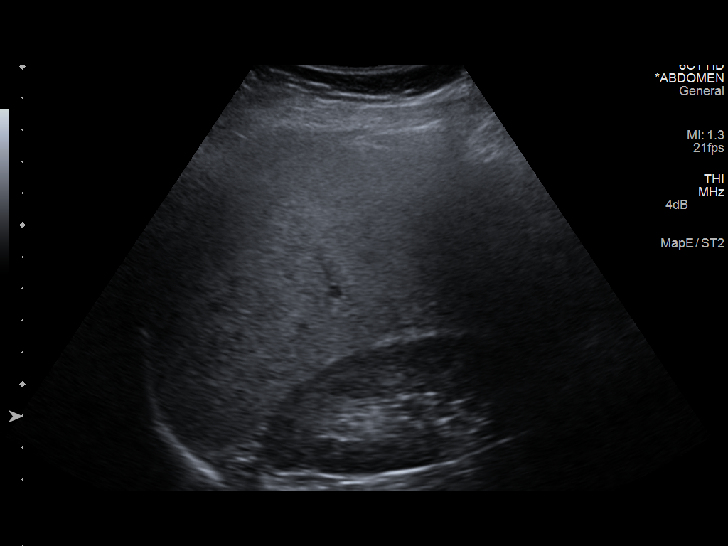
[im 7/75]
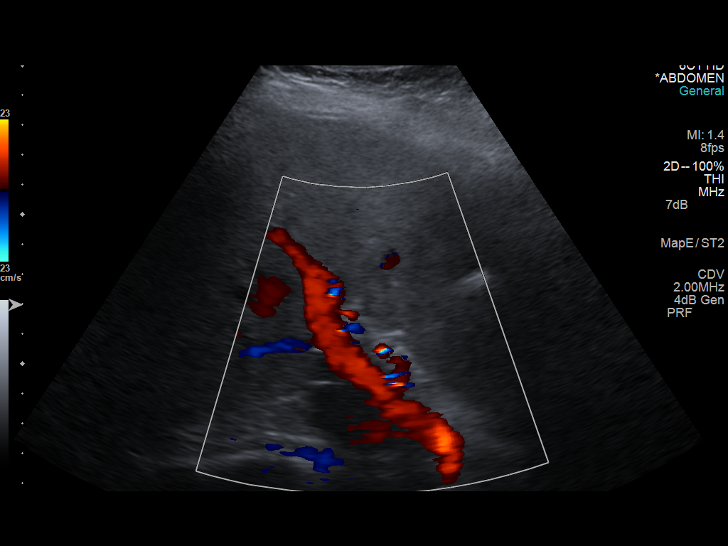
[im 13/75]
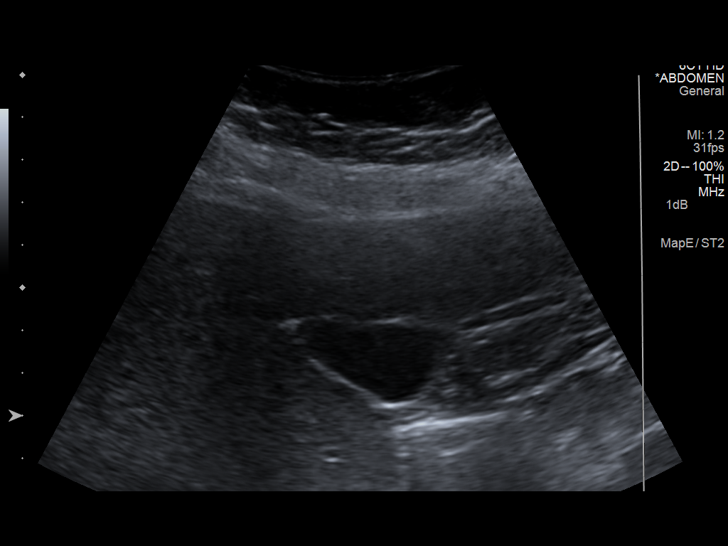
[im 19/75]
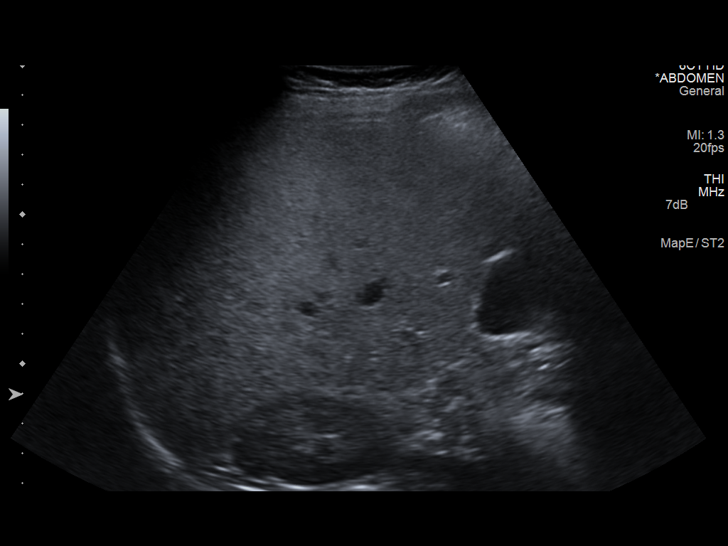
[im 25/75]
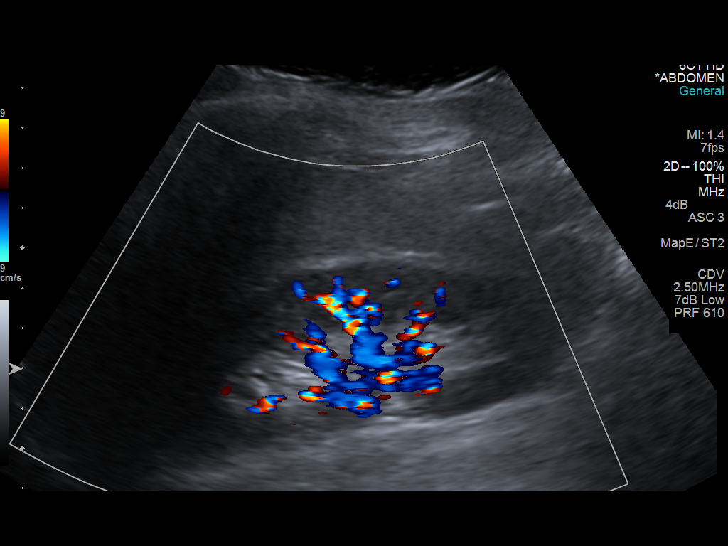
[im 28/75]
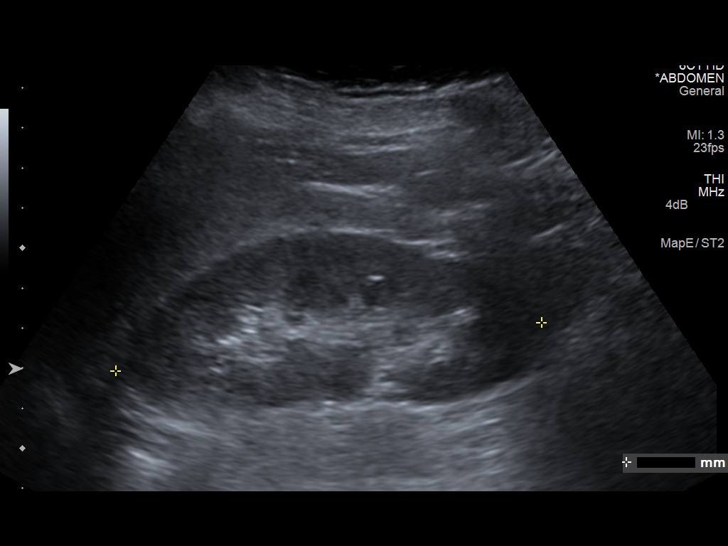
[im 34/75]
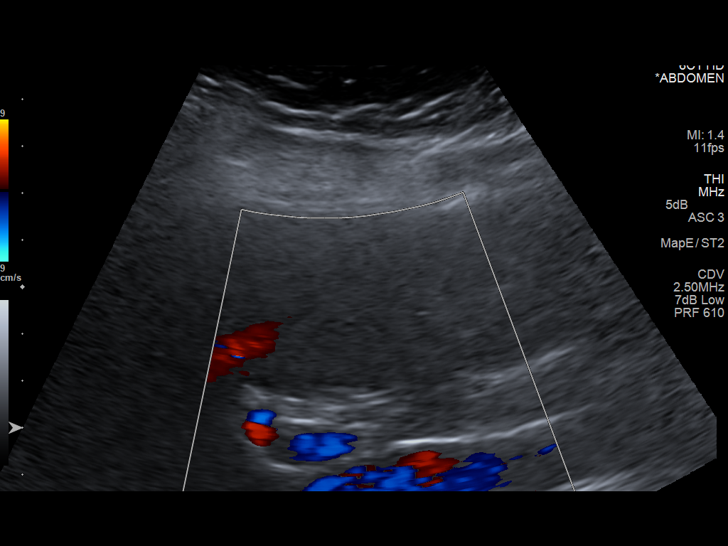
[im 41/75]
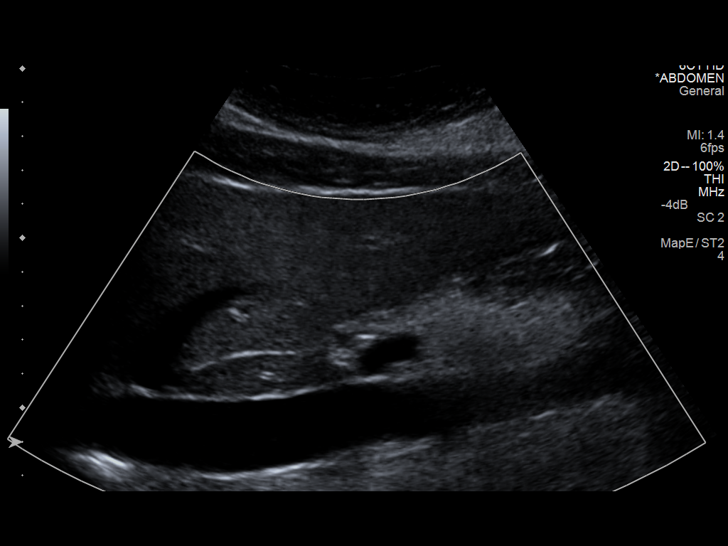
[im 47/75]
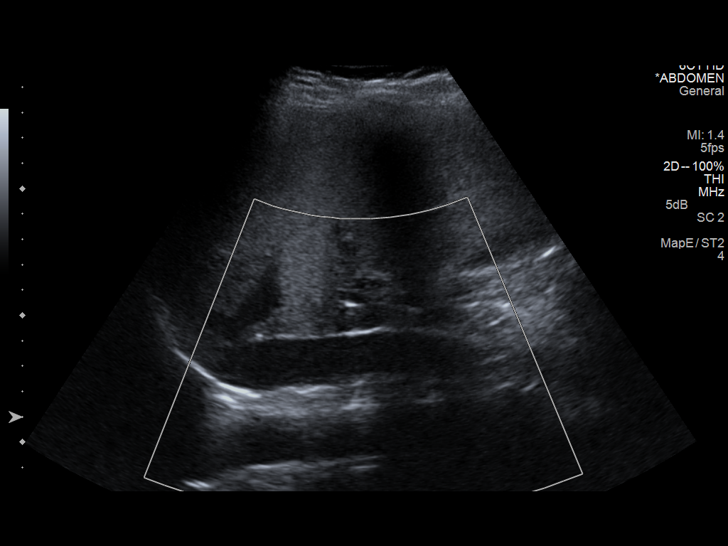
[im 50/75]
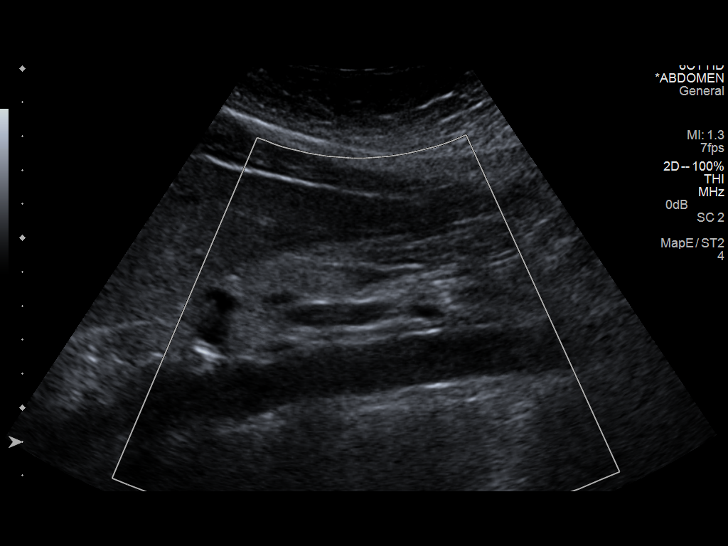
[im 56/75]
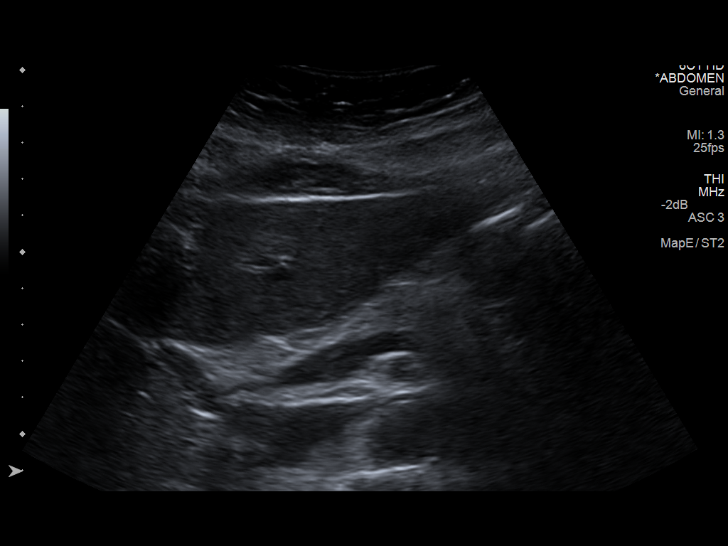
[im 62/75]
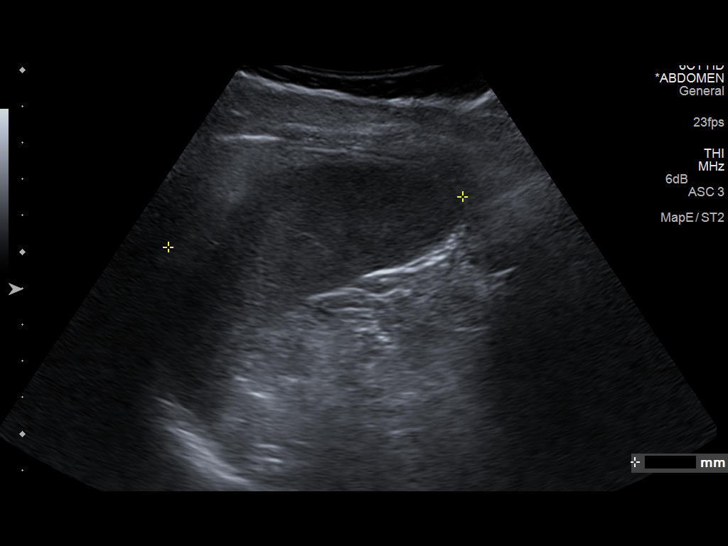
[im 68/75]
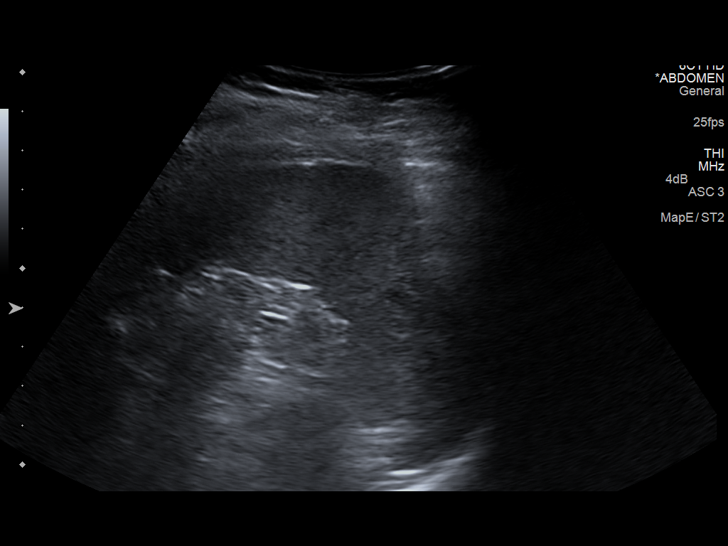
[im 75/75]
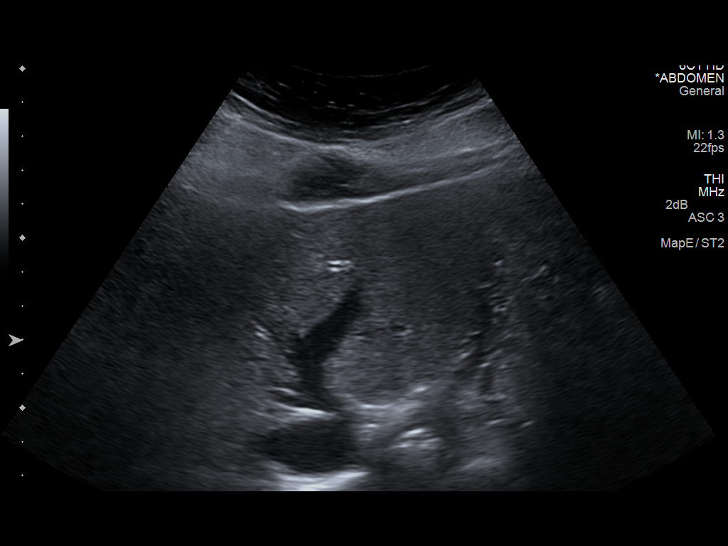

[14 of 25 positions shown; findings below may reference images not displayed]

FINDINGS: Gallbladder: No gallstones or wall thickening visualized. No
sonographic Murphy sign noted.

Common bile duct: Diameter: 4 mm

Liver: Increased parenchymal echogenicity. No mass or focal lesion.
Hepatopetal flow documented in the portal vein.

IVC: No abnormality visualized.

Pancreas: Visualized portion unremarkable.

Spleen: Size and appearance within normal limits.

Right Kidney: Length: 10.7 cm. Echogenicity within normal limits. No
mass or hydronephrosis visualized.

Left Kidney: Length: 10.4 cm. Echogenicity within normal limits. No
mass or hydronephrosis visualized.

Abdominal aorta: No aneurysm visualized.

Other findings: None.
IMPRESSION: 1. No acute findings.  Normal gallbladder.  No bile duct dilation.
2. Hepatic steatosis.  No other abnormalities.

## 2015-11-13 IMAGING — CR DG SHOULDER 2+V*L*
3 series · 3 of 3 positions shown · non-contrast
Comparison: None.

CLINICAL DATA: Fell in Friday May, 2014 injuring the left shoulder with
persistent pain and decreased range of motion.

EXAM:
LEFT SHOULDER - 2+ VIEW

[w shoulder ap internal left *]
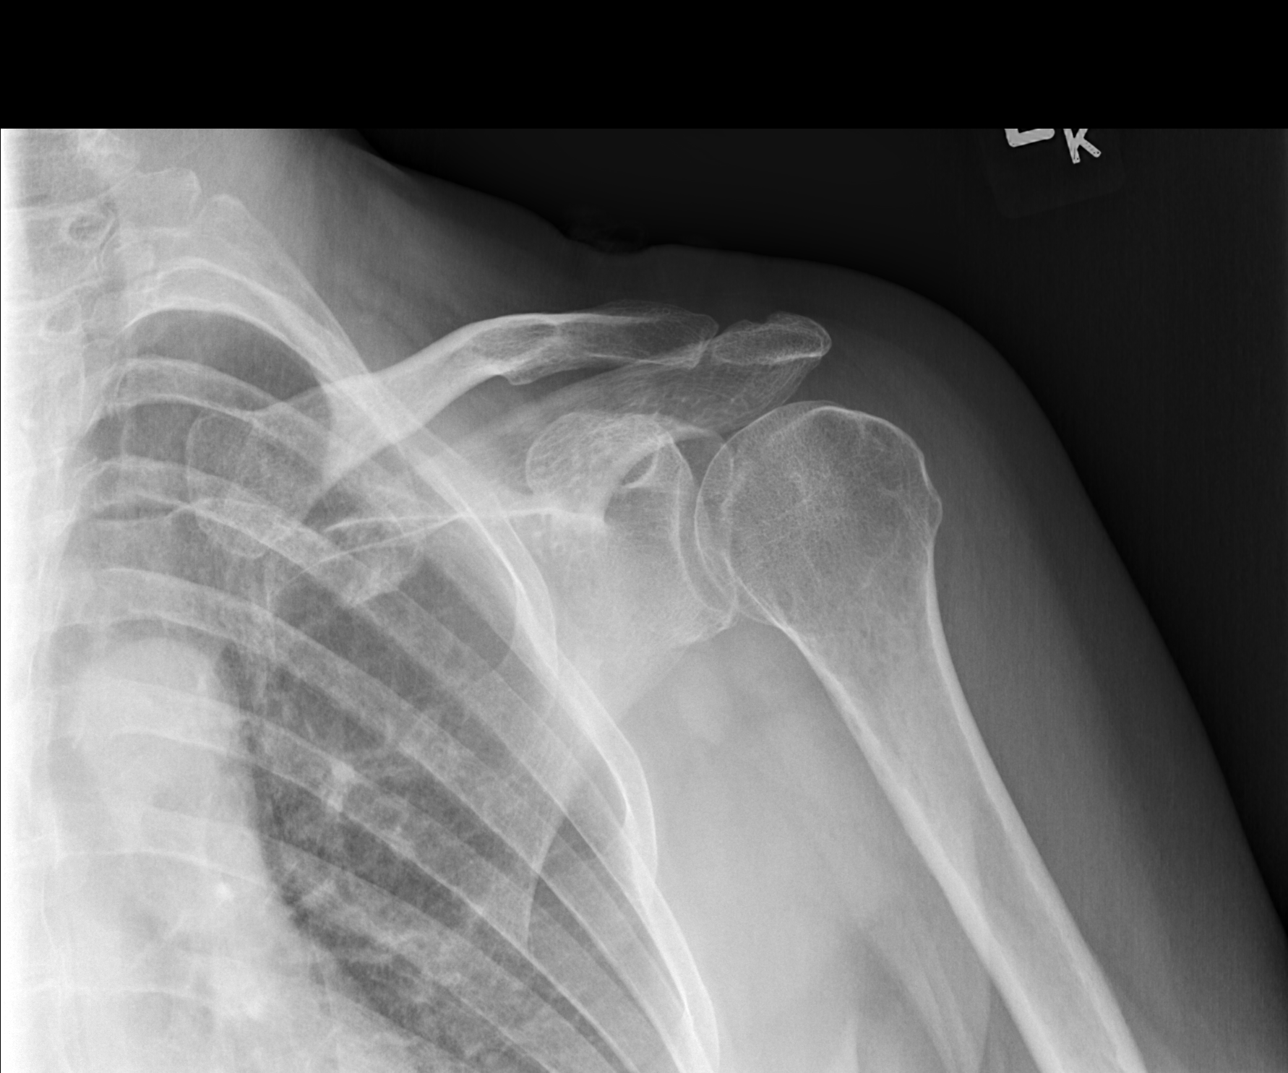

[w shoulder y view left *]
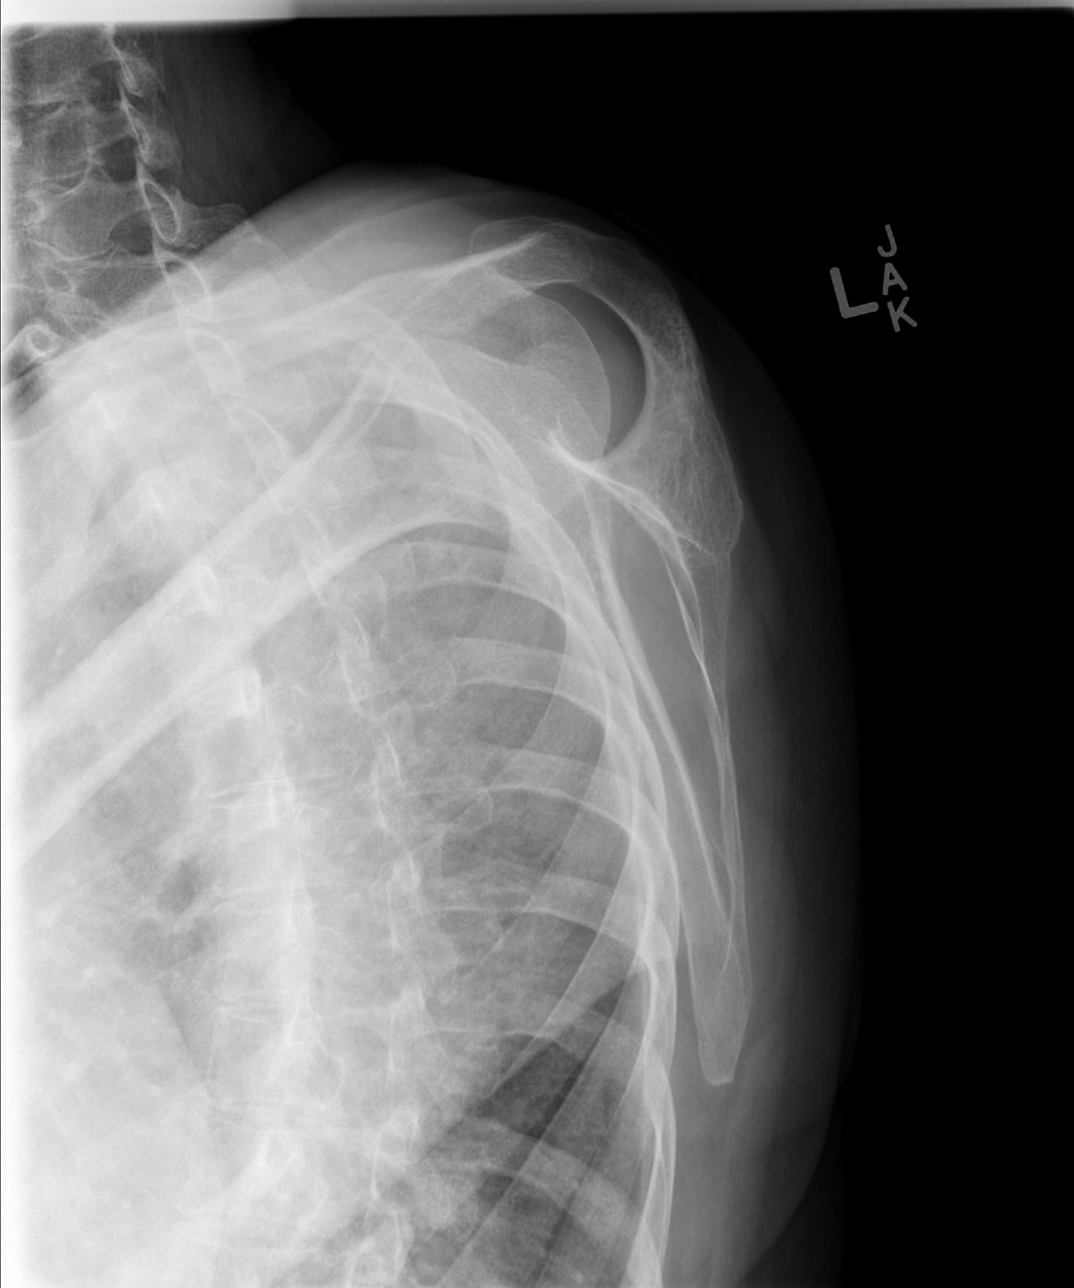

[w shoulder ap external left *]
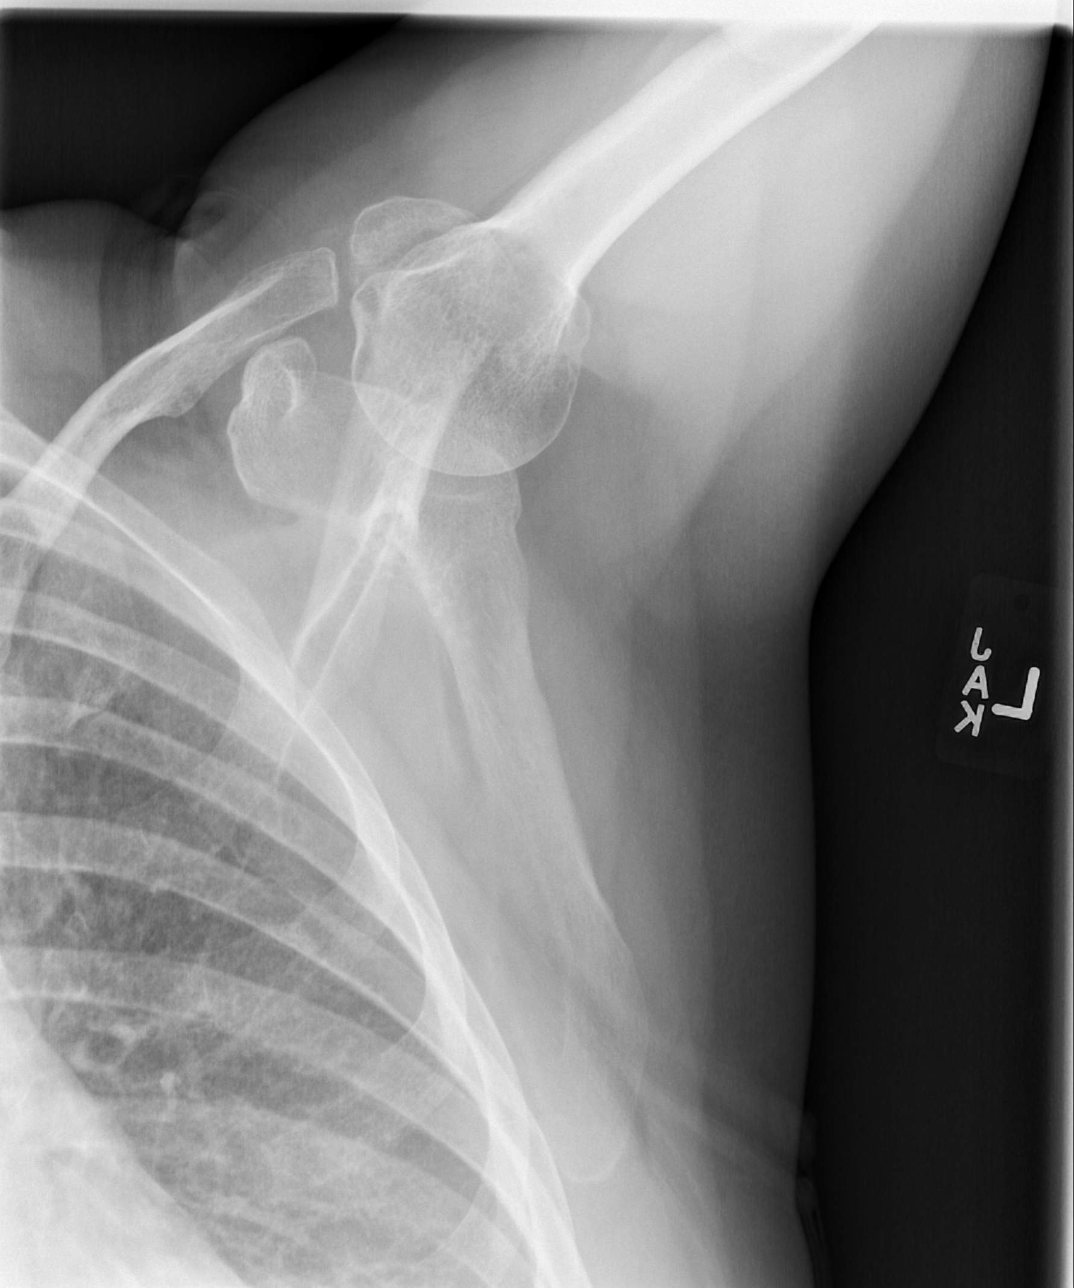

[3 of 3 positions shown; findings below may reference images not displayed]

FINDINGS: The bones of the left shoulder are adequately mineralized. The
articular surfaces of the humeral head and of the bony glenoid
appears smoothly rounded. The AC joint exhibits no acute or
significant chronic change. The subacromial subdeltoid space is
normal. The observed portions of the left clavicle and upper left
ribs are unremarkable.
IMPRESSION: There is no acute or significant chronic bony abnormality of the
left shoulder. Given the chronic symptoms, MRI may be useful.

## 2020-10-05 ENCOUNTER — Ambulatory Visit (INDEPENDENT_AMBULATORY_CARE_PROVIDER_SITE_OTHER): Payer: PRIVATE HEALTH INSURANCE | Admitting: Nurse Practitioner

## 2020-10-05 ENCOUNTER — Encounter: Payer: Self-pay | Admitting: Nurse Practitioner

## 2020-10-05 ENCOUNTER — Telehealth: Payer: Self-pay | Admitting: *Deleted

## 2020-10-05 ENCOUNTER — Other Ambulatory Visit: Payer: Self-pay

## 2020-10-05 VITALS — BP 120/78 | Ht 64.0 in | Wt 147.0 lb

## 2020-10-05 DIAGNOSIS — Z13820 Encounter for screening for osteoporosis: Secondary | ICD-10-CM

## 2020-10-05 DIAGNOSIS — Z01419 Encounter for gynecological examination (general) (routine) without abnormal findings: Secondary | ICD-10-CM

## 2020-10-05 NOTE — Patient Instructions (Signed)

## 2020-10-05 NOTE — Telephone Encounter (Signed)
-----   Message from [name], NP sent at 10/05/2020 11:21 AM EDT ----- She would like a referral for PCP, as well as for [facility]. Thank you

## 2020-10-05 NOTE — Addendum Note (Signed)
Addended by: Tito Dine on: 10/05/2020 11:39 AM   Modules accepted: Orders

## 2020-10-05 NOTE — Progress Notes (Signed)
   Kaylee Carter 03-Aug-1961 [id number]   History:  59 y.o. G1P1 presents as new patient to establish care. She recently moved back to [place] from [place]. Postmenopasual - no HRT, no bleeding. Uses Replens twice weekly with good relief of vaginal dryness. Normal pap and mammogram history. Sister diagnosed with breast cancer last year. BTL. She would like a PCP referral for management of hypothyroidism and HTN. Sexually active with new partner.   Gynecologic History No LMP recorded. Patient is postmenopausal.   Contraception: post menopausal status Last Pap: 5 years ago. Results were: normal Last mammogram: 10/2019. Results were: normal Last colonoscopy: 2013. Results were: normal Last Dexa: >5 years ago  Past medical history, past surgical history, family history and social history were all reviewed and documented in the EPIC chart.  ROS:  A ROS was performed and pertinent positives and negatives are included.  Exam:  Vitals:   10/05/20 1040  BP: 120/78  Weight: 147 lb (66.7 kg)  Height: 5\' 4"  (1.626 m)   Body mass index is 25.23 kg/m.  General appearance:  Normal Thyroid:  Symmetrical, normal in size, without palpable masses or nodularity. Respiratory  Auscultation:  Clear without wheezing or rhonchi Cardiovascular  Auscultation:  Regular rate, without rubs, murmurs or gallops  Edema/varicosities:  Not grossly evident Abdominal  Soft,nontender, without masses, guarding or rebound.  Liver/spleen:  No organomegaly noted  Hernia:  None appreciated  Skin  Inspection:  Grossly normal   Breasts: Examined lying and sitting.   Right: Without masses, retractions, discharge or axillary adenopathy.   Left: Without masses, retractions, discharge or axillary adenopathy. Gentitourinary   Inguinal/mons:  Normal without inguinal adenopathy  External genitalia:  Normal  BUS/Urethra/Skene's glands:  Normal  Vagina:  Normal  Cervix:  Normal  Uterus:  Anteverted, normal in size,  shape and contour.  Midline and mobile  Adnexa/parametria:     Rt: Without masses or tenderness.   Lt: Without masses or tenderness.  Anus and perineum: Non-bleeding hemorrhoids  Digital rectal exam: Normal sphincter tone without palpated masses or tenderness  Assessment/Plan:  59 y.o. G1P1 to establish care.   Well female exam with routine gynecological exam - Education provided on SBEs, importance of preventative screenings, current guidelines, high calcium diet, regular exercise, and multivitamin daily. Will do labs with PCP. We will send referral.   Screening for osteoporosis - DEXA > 5 years ago. We will send referral for Bone Density.   Screening for cervical cancer -normal Pap history.  Pap with high risk HPV today.  Screening for breast cancer - Normal mammogram history.  Sister diagnosed with breast cancer last year.  Normal breast exam today.  Continue annual screenings.  Screening for colon cancer -we will repeat colonoscopy recommended interval per GI.  Follow-up in 1 year for annual.     [facility], 11:07 AM 10/05/2020

## 2020-10-05 NOTE — Telephone Encounter (Signed)
Called patient and informed her no referral needed for PCP we recommend [facility] she can pick anyone from that group. I told patient Tiffany placed the order for bone density and she can call to schedule along with her mammogram. Patient said she will do so.

## 2020-10-10 LAB — PAP, TP IMAGING W/ HPV RNA, RFLX HPV TYPE 16,18/45: HPV DNA High Risk: NOT DETECTED

## 2020-12-22 ENCOUNTER — Other Ambulatory Visit: Payer: Self-pay

## 2020-12-22 ENCOUNTER — Encounter (HOSPITAL_COMMUNITY): Payer: Self-pay

## 2020-12-22 ENCOUNTER — Observation Stay (HOSPITAL_COMMUNITY)
Admission: EM | Admit: 2020-12-22 | Discharge: 2020-12-25 | Disposition: A | Discharge status: Home / Self Care | Payer: 59 | Attending: Internal Medicine | Admitting: Internal Medicine

## 2020-12-22 DIAGNOSIS — E039 Hypothyroidism, unspecified: Secondary | ICD-10-CM | POA: Insufficient documentation

## 2020-12-22 DIAGNOSIS — K5792 Diverticulitis of intestine, part unspecified, without perforation or abscess without bleeding: Secondary | ICD-10-CM | POA: Diagnosis present

## 2020-12-22 DIAGNOSIS — Z20822 Contact with and (suspected) exposure to covid-19: Secondary | ICD-10-CM | POA: Insufficient documentation

## 2020-12-22 DIAGNOSIS — R1032 Left lower quadrant pain: Secondary | ICD-10-CM | POA: Diagnosis present

## 2020-12-22 DIAGNOSIS — E876 Hypokalemia: Secondary | ICD-10-CM | POA: Diagnosis not present

## 2020-12-22 DIAGNOSIS — I10 Essential (primary) hypertension: Secondary | ICD-10-CM | POA: Diagnosis not present

## 2020-12-22 DIAGNOSIS — Z79899 Other long term (current) drug therapy: Secondary | ICD-10-CM | POA: Diagnosis not present

## 2020-12-22 DIAGNOSIS — K5732 Diverticulitis of large intestine without perforation or abscess without bleeding: Secondary | ICD-10-CM | POA: Diagnosis not present

## 2020-12-22 LAB — COMPREHENSIVE METABOLIC PANEL
ALT: 15 U/L (ref 0–44)
AST: 16 U/L (ref 15–41)
Albumin: 3.8 g/dL (ref 3.5–5.0)
Alkaline Phosphatase: 95 U/L (ref 38–126)
Anion gap: 13 (ref 5–15)
BUN: 9 mg/dL (ref 8–23)
CO2: 26 mmol/L (ref 22–32)
Calcium: 9.2 mg/dL (ref 8.9–10.3)
Chloride: 99 mmol/L (ref 98–111)
Creatinine, Ser: 0.78 mg/dL (ref 0.44–1.00)
GFR, Estimated: >60 mL/min (ref >60)
Glucose, Bld: 132 mg/dL — ABNORMAL HIGH (ref 70–99)
Potassium: 3.3 mmol/L — ABNORMAL LOW (ref 3.5–5.1)
Sodium: 138 mmol/L (ref 135–145)
Total Bilirubin: 0.7 mg/dL (ref 0.3–1.2)
Total Protein: 7.3 g/dL (ref 6.5–8.1)

## 2020-12-22 LAB — CBC
HCT: 42 % (ref 36.0–46.0)
Hemoglobin: 13.6 g/dL (ref 12.0–15.0)
MCH: 30.3 pg (ref 26.0–34.0)
MCHC: 32.4 g/dL (ref 30.0–36.0)
MCV: 93.5 fL (ref 80.0–100.0)
Platelets: 255 10*3/uL (ref 150–400)
RBC: 4.49 MIL/uL (ref 3.87–5.11)
RDW: 12.8 % (ref 11.5–15.5)
WBC: 13.5 10*3/uL — ABNORMAL HIGH (ref 4.0–10.5)
nRBC: 0 % (ref 0.0–0.2)

## 2020-12-22 LAB — URINALYSIS, ROUTINE W REFLEX MICROSCOPIC
Bilirubin Urine: NEGATIVE
Glucose, UA: NEGATIVE mg/dL
Hgb urine dipstick: NEGATIVE
Ketones, ur: NEGATIVE mg/dL
Leukocytes,Ua: NEGATIVE
Nitrite: NEGATIVE
Protein, ur: NEGATIVE mg/dL
Specific Gravity, Urine: 1.005 (ref 1.005–1.030)
pH: 7 (ref 5.0–8.0)

## 2020-12-22 LAB — LIPASE, BLOOD: Lipase: 24 U/L (ref 11–51)

## 2020-12-22 MED ORDER — BARIUM SULFATE 2.1 % PO SUSP
ORAL | Status: AC
  Filled 2020-12-22: qty 2

## 2020-12-22 NOTE — ED Triage Notes (Signed)
Pt reports severe lower abd pain for the past 3 days, had a tele health visit with pcp and they prescribed her abx but the pain is getting worse. Hx of bowel perforation in 2006 and states it feels similar. Pt seems uncomfortable in triage.

## 2020-12-23 ENCOUNTER — Emergency Department (HOSPITAL_COMMUNITY): Payer: 59

## 2020-12-23 ENCOUNTER — Encounter (HOSPITAL_COMMUNITY): Payer: Self-pay | Admitting: Internal Medicine

## 2020-12-23 ENCOUNTER — Other Ambulatory Visit: Payer: Self-pay

## 2020-12-23 DIAGNOSIS — K5792 Diverticulitis of intestine, part unspecified, without perforation or abscess without bleeding: Secondary | ICD-10-CM | POA: Diagnosis present

## 2020-12-23 DIAGNOSIS — E039 Hypothyroidism, unspecified: Secondary | ICD-10-CM | POA: Diagnosis present

## 2020-12-23 DIAGNOSIS — I10 Essential (primary) hypertension: Secondary | ICD-10-CM | POA: Diagnosis present

## 2020-12-23 LAB — CBG MONITORING, ED: Glucose-Capillary: 116 mg/dL — ABNORMAL HIGH (ref 70–99)

## 2020-12-23 LAB — SARS CORONAVIRUS 2 (TAT 6-24 HRS): SARS Coronavirus 2: NEGATIVE

## 2020-12-23 LAB — HIV ANTIBODY (ROUTINE TESTING W REFLEX): HIV Screen 4th Generation wRfx: NONREACTIVE

## 2020-12-23 MED ORDER — ONDANSETRON HCL 4 MG/2ML IJ SOLN
4.0000 mg | Freq: Once | INTRAMUSCULAR | Status: AC
  Administered 2020-12-23: 4 mg via INTRAVENOUS
  Filled 2020-12-23: qty 2

## 2020-12-23 MED ORDER — DEXTROSE-NACL 5-0.9 % IV SOLN: INTRAVENOUS | Status: AC

## 2020-12-23 MED ORDER — LEVOTHYROXINE SODIUM 100 MCG/5ML IV SOLN
50.0000 ug | Freq: Every day | INTRAVENOUS | Status: DC
  Administered 2020-12-23 – 2020-12-25 (×3): 50 ug via INTRAVENOUS
  Filled 2020-12-23 (×5): qty 5

## 2020-12-23 MED ORDER — POTASSIUM CHLORIDE CRYS ER 20 MEQ PO TBCR
40.0000 meq | EXTENDED_RELEASE_TABLET | Freq: Once | ORAL | Status: AC
  Administered 2020-12-23: 40 meq via ORAL
  Filled 2020-12-23: qty 2

## 2020-12-23 MED ORDER — PIPERACILLIN-TAZOBACTAM 3.375 G IVPB
3.3750 g | Freq: Three times a day (TID) | INTRAVENOUS | Status: DC
  Administered 2020-12-23 – 2020-12-25 (×7): 3.375 g via INTRAVENOUS
  Filled 2020-12-23 (×10): qty 50

## 2020-12-23 MED ORDER — BARIUM SULFATE 2 % PO SUSP: 900.0000 mL | Freq: Once | ORAL | Status: DC

## 2020-12-23 MED ORDER — LACTATED RINGERS IV BOLUS
1000.0000 mL | Freq: Once | INTRAVENOUS | Status: AC
  Administered 2020-12-23: 1000 mL via INTRAVENOUS

## 2020-12-23 MED ORDER — PIPERACILLIN-TAZOBACTAM 3.375 G IVPB 30 MIN
3.3750 g | Freq: Once | INTRAVENOUS | Status: AC
  Administered 2020-12-23: 3.375 g via INTRAVENOUS
  Filled 2020-12-23: qty 50

## 2020-12-23 MED ORDER — ACETAMINOPHEN 325 MG PO TABS
650.0000 mg | ORAL_TABLET | Freq: Four times a day (QID) | ORAL | Status: DC | PRN
  Administered 2020-12-24 (×2): 650 mg via ORAL
  Filled 2020-12-23 (×3): qty 2

## 2020-12-23 MED ORDER — HYDRALAZINE HCL 20 MG/ML IJ SOLN: 10.0000 mg | INTRAMUSCULAR | Status: DC | PRN

## 2020-12-23 MED ORDER — MORPHINE SULFATE (PF) 2 MG/ML IV SOLN
1.0000 mg | Freq: Once | INTRAVENOUS | Status: AC
  Administered 2020-12-23: 1 mg via INTRAVENOUS
  Filled 2020-12-23: qty 1

## 2020-12-23 MED ORDER — ACETAMINOPHEN 650 MG RE SUPP: 650.0000 mg | Freq: Four times a day (QID) | RECTAL | Status: DC | PRN

## 2020-12-23 MED ORDER — MORPHINE SULFATE (PF) 4 MG/ML IV SOLN
4.0000 mg | Freq: Once | INTRAVENOUS | Status: AC
  Administered 2020-12-23: 4 mg via INTRAVENOUS
  Filled 2020-12-23: qty 1

## 2020-12-23 NOTE — ED Notes (Signed)
Pt transported to CT ?

## 2020-12-23 NOTE — ED Provider Notes (Signed)
[facility] EMERGENCY DEPARTMENT Provider Note   CSN: [id number] Arrival date & time: 12/22/20  1613   History Chief Complaint  Patient presents with  . Abdominal Pain    Kaylee Carter is a 60 y.o. female.  The history is provided by the patient.  Abdominal Pain  She has she has a history of hypertension, diverticulitis comes in with lower abdominal pain for the last 2 days.  Pain started in the suprapubic area and has moved to the left lower quadrant and has been getting progressively worse.  Pain comes in waves and is as severe as 9/10.  There has been some associated nausea but no vomiting and no diarrhea.  She denies fever, chills, sweats.  She denies any urinary symptoms.  With her previous episode of diverticulitis, she did have abscess and perforation and had a partial bowel resection.  Current symptoms feel similar.  She feels better if she stays perfectly still, worse with any movement.  She did have a virtual visit with a physician who prescribed ciprofloxacin and metronidazole, but symptoms have been getting worse instead of better.  Past Medical History:  Diagnosis Date  . Diverticulitis   . Hypertension   . Thyroid disease     There are no problems to display for this patient.   Past Surgical History:  Procedure Laterality Date  . APPENDECTOMY    . BREAST SURGERY    . NOSE SURGERY    . SMALL INTESTINE SURGERY    . TUBAL LIGATION       OB History    Gravida  1   Para      Term      Preterm      AB  0   Living  1     SAB  0   IAB      Ectopic  0   Multiple      Live Births              Family History  Problem Relation Age of Onset  . Cancer Father   . Diabetes Father   . Breast cancer Sister   . Diabetes Sister     Social History   Tobacco Use  . Smoking status: Never Smoker  . Smokeless tobacco: Never Used  Substance Use Topics  . Alcohol use: No  . Drug use: No    Home Medications Prior to Admission  medications   Medication Sig Start Date End Date Taking? Authorizing Provider  clobetasol cream (TEMOVATE) 0.05 % Apply topically 2 (two) times daily. 09/18/20   [provider]  hydrochlorothiazide (HYDRODIURIL) 12.5 MG tablet Take 12.5 mg by mouth daily. 09/18/20   [provider]  ibuprofen (ADVIL,MOTRIN) 200 MG tablet Take 400 mg by mouth every 6 (six) hours as needed. For pain    [provider]  Ketoconazole-Hydrocortisone (KETOCON EX) Apply 1 application topically daily. For facial eczema    [provider]  levothyroxine (SYNTHROID, LEVOTHROID) 100 MCG tablet Take 100 mcg by mouth daily.    [provider]  losartan (COZAAR) 50 MG tablet Take 50 mg by mouth daily. 09/18/20   [provider]  Multiple Vitamin (MULTIVITAMIN WITH MINERALS) TABS Take 1 tablet by mouth daily.     [provider]  Omega-3 Fatty Acids (FISH OIL PO) Take 1 capsule by mouth daily.    [provider]  omeprazole (PRILOSEC) 20 MG capsule Take 1 capsule (20 mg total) by mouth 2 (two)  times daily before a meal. 08/05/15   Everlene Farrier, PA-C  Sulfacetamide Sodium-Sulfur (SULFACETAMIDE SOD-SULFUR [facility] EX) Apply 1 application topically daily. For facial eczema    [provider]    Allergies    Ivp dye [iodinated diagnostic agents]  Review of Systems   Review of Systems  Gastrointestinal: Positive for abdominal pain.  All other systems reviewed and are negative.   Physical Exam Updated Vital Signs BP 130/84 (BP Location: Left Arm)   Pulse 66   Temp (!) 97.5 F (36.4 C) (Oral)   Resp 18   Ht 5\' 4"  (1.626 m)   Wt 63.5 kg   SpO2 95%   BMI 24.03 kg/m   Physical Exam Vitals and nursing note reviewed.   60 year old female, resting comfortably and in no acute distress. Vital signs are 99. Oxygen saturation is 95%, which is normal. Head is normocephalic and atraumatic. PERRLA, EOMI. Oropharynx is clear. Neck is nontender and  supple without adenopathy or JVD. Back is nontender in the midline.  There is mild left CVA tenderness. Lungs are clear without rales, wheezes, or rhonchi. Chest is nontender. Heart has regular rate and rhythm without murmur. Abdomen is soft, slightly distended, and tender diffusely.  Maximum tenderness is in the left mid abdomen.  There is no voluntary guarding but rebound tenderness is present.  There are no masses or hepatosplenomegaly and peristalsis is slightly hypoactive. Extremities have no cyanosis or edema, full range of motion is present. Skin is warm and dry without rash. Neurologic: Mental status is normal, cranial nerves are intact, there are no motor or sensory deficits.  ED Results / Procedures / Treatments   Labs (all labs ordered are listed, but only abnormal results are displayed) Labs Reviewed  COMPREHENSIVE METABOLIC PANEL - Abnormal; Notable for the following components:      Result Value   Potassium 3.3 (*)    Glucose, Bld 132 (*)    All other components within normal limits  CBC - Abnormal; Notable for the following components:   WBC 13.5 (*)    All other components within normal limits  URINALYSIS, ROUTINE W REFLEX MICROSCOPIC - Abnormal; Notable for the following components:   Color, Urine STRAW (*)    All other components within normal limits  LIPASE, BLOOD    EKG None  Radiology No results found.  Procedures Procedures   Medications Ordered in ED Medications  Barium Sulfate 2.1 % SUSP (has no administration in time range)  ondansetron (ZOFRAN) injection 4 mg (has no administration in time range)  morphine 4 MG/ML injection 4 mg (has no administration in time range)  lactated ringers bolus 1,000 mL (has no administration in time range)  potassium chloride SA (KLOR-CON) CR tablet 40 mEq (has no administration in time range)  piperacillin-tazobactam (ZOSYN) IVPB 3.375 g (has no administration in time range)    ED Course  I have reviewed the  triage vital signs and the nursing notes.  Pertinent labs & imaging results that were available during my care of the patient were reviewed by me and considered in my medical decision making (see chart for details).  MDM Rules/Calculators/A&P Abdominal pain with evidence of peritonitis on exam very concerning for diverticulitis with perforation or abscess.  Other diagnostic possibilities include abdominal aortic aneurysm, urinary tract infection with pyelonephritis.  These are all conditions with significant morbidity and mortality.  Labs obtained in triage showed mild leukocytosis and mild hypokalemia.  She is given a dose of oral potassium.  CT of abdomen and pelvis has been ordered.  This is ordered without IV contrast because of IV contrast allergy.  She is empirically started on IV piperacillin-tazobactam, given IV fluids and IV morphine and ondansetron.  Old records are reviewed showing prior ED visit for diverticulitis at which time she did have a nonobstructing right renal calculus but no evidence of abdominal aneurysm.  She had adequate relief of pain with single dose of morphine.  CT scan shows diverticulitis without perforation or abscess.  She will need to be admitted for failure of outpatient management.  Case is discussed with Dr. Toniann Fail of [facility], who agrees to admit the patient.  Final Clinical Impression(s) / ED Diagnoses Final diagnoses:  Diverticulitis of large intestine without complication  Hypokalemia    Rx / DC Orders ED Discharge Orders    None       Dione Booze, MD 12/23/20 [phone]

## 2020-12-23 NOTE — Consult Note (Signed)
[facility] Consult  Referring Provider: Dr.KC/[facility] Primary Care Physician:  Patient, No Pcp Per Primary Gastroenterologist: Dr. Evette Cristal  Reason for Consultation: Acute sigmoid diverticulitis  HPI: Kaylee Carter is a 60 y.o. female was in her usual state of health until Wednesday/4 days ago when she developed left lower quadrant abdominal pain which progressively worsened in intensity. Patient relocated from [place] and has an upcoming appointment with a primary care physician Dr. Lupe Carney from [facility] in 1-1/2 weeks.  Since she does not have a primary care physician and her insurance would not cover a visit to urgent care, she used an app provided by her insurance and was advised to start ciprofloxacin and Flagyl.  She took 1 dose of both the antibiotics yesterday but since the abdominal pain continued decided to come to the ER. Patient has had history of perforation related to diverticulitis in 2006 for which she required surgical resection, status post anastomosis. Because of history of perforation in the past, patient expressed that she was scared of another complication and decided to come to the ER.  She has been taking ibuprofen at home for pain control. While waiting in the ER for 10 hours she took another dose of Cipro and Flagyl. Patient denies nausea, vomiting, fever, chills or rigors. She states she eats a high-fiber diet and takes stool softeners and it is normal for her to have 2 to 3 bowel movements a day. For the last several days she reports having small amount of formed bowel movements up to 6 times a day. Last bowel movement was yesterday evening. She is passing flatus.  She lost about 30 pounds last year, which she attributes to stress from recent divorce. Otherwise, patient denies chronic GI symptoms.  Last colonoscopy from 2013 for heme positive stools was normal except for diverticulosis in sigmoid.  Repeat colonoscopy was recommended in 10  years. Colonoscopy from 2011 showed anastomosis at 20 cm, internal hemorrhoids, otherwise unremarkable.   Past Medical History:  Diagnosis Date  . Diverticulitis   . Hypertension   . Thyroid disease     Past Surgical History:  Procedure Laterality Date  . APPENDECTOMY    . BREAST SURGERY    . COLON SURGERY    . NOSE SURGERY    . SMALL INTESTINE SURGERY    . TUBAL LIGATION      Prior to Admission medications   Medication Sig Start Date End Date Taking? Authorizing Provider  cholecalciferol (VITAMIN D3) 25 MCG (1000 UNIT) tablet Take 3,000 Units by mouth daily.   Yes [provider]  ciprofloxacin (CIPRO) 500 MG tablet Take 500 mg by mouth 2 (two) times daily. 12/22/20  Yes [provider]  clobetasol cream (TEMOVATE) 0.05 % Apply topically 2 (two) times daily. 09/18/20  Yes [provider]  hydrochlorothiazide (HYDRODIURIL) 12.5 MG tablet Take 12.5 mg by mouth daily. 09/18/20  Yes [provider]  ibuprofen (ADVIL,MOTRIN) 200 MG tablet Take 400 mg by mouth every 6 (six) hours as needed for fever or mild pain. For pain   Yes [provider]  levothyroxine (SYNTHROID, LEVOTHROID) 100 MCG tablet Take 100 mcg by mouth daily.   Yes [provider]  losartan (COZAAR) 50 MG tablet Take 50 mg by mouth daily. 09/18/20  Yes [provider]  MAGNESIUM OXIDE PO Take 1 tablet by mouth at bedtime.   Yes [provider]  melatonin 1 MG TABS tablet Take 1 mg by mouth at bedtime.   Yes [provider]  metroNIDAZOLE (FLAGYL) 500 MG tablet Take 500 mg by mouth 2 (two) times daily. 12/22/20  Yes [provider]  Multiple Vitamin (MULTIVITAMIN WITH MINERALS) TABS Take 1 tablet by mouth daily.    Yes [provider]  Omega-3 Fatty Acids (FISH OIL PO) Take 1-3 capsules by mouth daily.   Yes [provider]  Sulfacetamide Sodium, Acne, 10 % LOTN Apply 1 application topically daily.   Yes [provider]    Current Facility-Administered Medications  Medication Dose Route Frequency Provider Last Rate Last Admin  . acetaminophen (TYLENOL) tablet 650 mg  650 mg Oral Q6H PRN Eduard ClosKakrakandy, Arshad N, MD       Or  . acetaminophen (TYLENOL) suppository 650 mg  650 mg Rectal Q6H PRN Eduard ClosKakrakandy, Arshad N, MD      . dextrose 5 %-0.9 % sodium chloride infusion   Intravenous Continuous Eduard ClosKakrakandy, Arshad N, MD 125 mL/hr at 12/23/20 1426 New Bag at 12/23/20 1426  . hydrALAZINE (APRESOLINE) injection 10 mg  10 mg Intravenous Q4H PRN Eduard ClosKakrakandy, Arshad N, MD      . levothyroxine (SYNTHROID, LEVOTHROID) injection 50 mcg  50 mcg Intravenous Daily Eduard ClosKakrakandy, Arshad N, MD      . piperacillin-tazobactam (ZOSYN) IVPB 3.375 g  3.375 g Intravenous Q8H Eduard ClosKakrakandy, Arshad N, MD   Stopped at 12/23/20 1213    Allergies as of 12/22/2020 - Review Complete 12/22/2020  Allergen Reaction Noted  . Ivp dye [iodinated diagnostic agents] Swelling 05/30/2012    Family History  Problem Relation Age of Onset  . Cancer Father   . Diabetes Father   . Breast cancer Sister   . Diabetes Sister     Social History   Socioeconomic History  . Marital status: Divorced    Spouse name: Not on file  . Number of children: Not on file  . Years of education: Not on file  . Highest education level: Not on file  Occupational History  . Not on file  Tobacco Use  . Smoking status: Never Smoker  . Smokeless tobacco: Never Used  Substance and Sexual Activity  . Alcohol use: No  . Drug use: No  . Sexual activity: Yes    Comment: intercourse age 60, more than 5 sexyal partners  Other Topics Concern  . Not on file  Social History Narrative  . Not on file   Social Determinants of Health   Financial Resource Strain: Not on file  Food Insecurity: Not on file  Transportation Needs: Not on file  Physical Activity: Not on file  Stress: Not on file  Social Connections: Not on file  Intimate Partner Violence: Not on  file    Review of Systems: Positive for: GI: Described in detail in HPI.    Gen: involuntary weight loss, denies any fever, chills, rigors, night sweats, anorexia, fatigue, weakness, malaise,  and sleep disorder CV: Denies chest pain, angina, palpitations, syncope, orthopnea, PND, peripheral edema, and claudication. Resp: Denies dyspnea, cough, sputum, wheezing, coughing up blood. GU : Denies urinary burning, blood in urine, urinary frequency, urinary hesitancy, nocturnal urination, and urinary incontinence. MS: Denies joint pain or swelling.  Denies muscle weakness, cramps, atrophy.  Derm: Denies rash, itching, oral ulcerations, hives, unhealing ulcers.  Psych: Denies depression, anxiety, memory loss, suicidal ideation, hallucinations,  and confusion. Heme: Denies bruising, bleeding, and enlarged lymph nodes. Neuro:  Denies any headaches, dizziness, paresthesias. Endo:  hypothyroid, denies any problems with DM or adrenal function.  Physical Exam: Vital signs  in last 24 hours: Temp:  [97.5 F (36.4 C)-98.6 F (37 C)] 98.2 F (36.8 C) (01/15 1428) Pulse Rate:  [64-113] 71 (01/15 1428) Resp:  [16-24] 20 (01/15 1428) BP: (103-170)/(63-89) 123/71 (01/15 1428) SpO2:  [94 %-100 %] 95 % (01/15 1428) Weight:  [63.5 kg] 63.5 kg (01/14 1618) Last BM Date: 12/22/20  General:   Alert,  Well-developed, well-nourished, pleasant and cooperative in NAD Head:  Normocephalic and atraumatic. Eyes:  Sclera clear, no icterus.   Conjunctiva pink. Ears:  Normal auditory acuity. Nose:  No deformity, discharge,  or lesions. Mouth:  No deformity or lesions.  Oropharynx pink & moist. Neck:  Supple; no masses or thyromegaly. Lungs:  Clear throughout to auscultation.   No wheezes, crackles, or rhonchi. No acute distress. Heart:  Regular rate and rhythm; no murmurs, clicks, rubs,  or gallops. Extremities:  Without clubbing or edema. Neurologic:  Alert and  oriented x4;  grossly normal neurologically. Skin:   Intact without significant lesions or rashes. Psych:  Alert and cooperative. Normal mood and affect. Abdomen:  Soft, minimal left lower quadrant and nondistended. No masses, hepatosplenomegaly or hernias noted. Normal bowel sounds, without guarding, and without rebound.   Well-healed lower midline surgical scar     Lab Results: Recent Labs    12/22/20 1620  WBC 13.5*  HGB 13.6  HCT 42.0  PLT 255   BMET Recent Labs    12/22/20 1620  NA 138  K 3.3*  CL 99  CO2 26  GLUCOSE 132*  BUN 9  CREATININE 0.78  CALCIUM 9.2   LFT Recent Labs    12/22/20 1620  PROT 7.3  ALBUMIN 3.8  AST 16  ALT 15  ALKPHOS 95  BILITOT 0.7   PT/INR No results for input(s): LABPROT, INR in the last 72 hours.  Studies/Results: CT ABDOMEN PELVIS WO CONTRAST  Result Date: 12/23/2020 CLINICAL DATA:  Abdominal pain, question of diverticulitis EXAM: CT ABDOMEN AND PELVIS WITHOUT CONTRAST TECHNIQUE: Multidetector CT imaging of the abdomen and pelvis was performed following the standard protocol without IV contrast. COMPARISON:  June 08, 2012 FINDINGS: Lower chest: The visualized heart size within normal limits. No pericardial fluid/thickening. No hiatal hernia. The visualized portions of the lungs are clear. Hepatobiliary: Although limited due to the lack of intravenous contrast, normal in appearance without gross focal abnormality. No evidence of calcified gallstones or biliary ductal dilatation. Pancreas:  Unremarkable.  No surrounding inflammatory changes. Spleen: Normal in size. Although limited due to the lack of intravenous contrast, normal in appearance. Adrenals/Urinary Tract: Both adrenal glands appear normal. The kidneys and collecting system appear normal without evidence of urinary tract calculus or hydronephrosis. Bladder is unremarkable. Stomach/Bowel: The stomach, small bowel, are normal in appearance. There is a focal segment of proximal sigmoid colon with diffuse wall thickening diverticula  and surrounding fat stranding changes. There is non loculated fluid seen along the posterior aspect of the proximal sigmoid colon. No definite free air is seen. Vascular/Lymphatic: There are no enlarged abdominal or pelvic lymph nodes. No significant gross vascular findings are present given the lack of intravenous contrast. Reproductive: The uterus and adnexa are unremarkable. Other: No evidence of abdominal wall mass or hernia. Musculoskeletal: No acute or significant osseous findings. IMPRESSION: Findings consistent with proximal sigmoid colonic diverticulitis. No loculated fluid collections or free air. Electronically Signed   By: Jonna Clark M.D.   On: 12/23/2020 02:38    Impression: Proximal sigmoid colon diverticulitis without complication(no fluid collection, no perforation) Mild  leukocytosis, WBC 13.5 Mild hypokalemia, potassium 3.3  Plan: Patient did not fail outpatient oral antibiotic therapy. She only received 1 dose of Cipro and Flagyl before coming to the ER and 1 dose of Cipro and Flagyl while waiting in the ER.  Currently admitted with acute uncomplicated diverticulitis. Abdominal examination is relatively benign.  Agree with IV fluids, currently on D5 normal saline at 125 mL/h Has been started on IV Zosyn 3.375 g every 8 hours  Will start the patient on clear liquid diet,ok to advance to regular diet in a.m.- if patient continues to have benign abdomen/decrease in intensity of pain/continues to have bowel movements.  If improvement noted in the next 24 hours, okay to discharge home on oral antibiotics for 10 days.   LOS: 0 days   Kerin Salen, MD  12/23/2020, 2:53 PM

## 2020-12-23 NOTE — Progress Notes (Signed)
Patient seen and examined personally, I reviewed the chart, history and physical and admission note, done by admitting physician this morning and agree with the same with following addendum.  Please refer to the morning admission note for more detailed plan of care. Discussed w/ admitting MD  Still C/o LLQ abd pain, passing gas, last eaten yesterday.  Briefly,  60 year old female with hypertension, postablative hypothyroidism, history of diverticulitis 15 years ago and had partial colectomy in [place] presented to the ED with abdominal pain for 3 days and left lower quadrant, treated with Cipro Flagyl by primary care physician but patient did not improve so came to the ED, where CT abdomen showed acute diverticulitis with no perforation or abscess due to persistent pain and not improved by oral antibiotics patient was admitted.  Labs with leukocytosis and hypokalemia  Acute sigmoid diverticulitis tried outpatient oral antibiotics x 1 dose, admitted with worsening abd pain,will cont with IV Zosyn.  She has complicated history of diverticulitis 15 years ago with abscess- needing partial colectomy.  Remains n.p.o. on IV fluids. CLD later today , Get GI eval. Other issues- Hypertension Hypokalemia Postablative hypothyroidism

## 2020-12-23 NOTE — H&P (Signed)
History and Physical    Pamula Luther XVQ:[id number] DOB: 14-Nov-1961 DOA: 12/22/2020  PCP: Patient, No Pcp Per   Patient coming from: Home.  Chief Complaint: Abdominal pain.  HPI: Monike Bragdon is a 60 y.o. female with history of hypertension and post ablative hypothyroidism who has experienced diverticulitis about 15 years ago and has undergone partial colectomy in [place] presents to the ER with worsening pain over the 3 days.  Patient's pain started 3 days ago mostly in the lower quadrant moving across the abdomen with no nausea or vomiting or diarrhea.  Primary care called in Cipro and Flagyl despite taking which the pain was not getting better patient decided to come to the ER.  ED Course: In the ER patient is afebrile CT abdomen pelvis shows features consistent with acute diverticulitis with no perforation or abscess.  Given that patient has persistent pain and has not improved despite oral antibiotics admitted for further observation.  Labs are significant for mild hypokalemia leukocytosis of 13.5 COVID test is pending.  Patient was started on empiric antibiotics.  Review of Systems: As per HPI, rest all negative.   Past Medical History:  Diagnosis Date  . Diverticulitis   . Hypertension   . Thyroid disease     Past Surgical History:  Procedure Laterality Date  . APPENDECTOMY    . BREAST SURGERY    . COLON SURGERY    . NOSE SURGERY    . SMALL INTESTINE SURGERY    . TUBAL LIGATION       reports that she has never smoked. She has never used smokeless tobacco. She reports that she does not drink alcohol and does not use drugs.  Allergies  Allergen Reactions  . Ivp Dye [Iodinated Diagnostic Agents] Swelling    Family History  Problem Relation Age of Onset  . Cancer Father   . Diabetes Father   . Breast cancer Sister   . Diabetes Sister     Prior to Admission medications   Medication Sig Start Date End Date Taking? Authorizing Provider  clobetasol  cream (TEMOVATE) 0.05 % Apply topically 2 (two) times daily. 09/18/20   [provider]  hydrochlorothiazide (HYDRODIURIL) 12.5 MG tablet Take 12.5 mg by mouth daily. 09/18/20   [provider]  ibuprofen (ADVIL,MOTRIN) 200 MG tablet Take 400 mg by mouth every 6 (six) hours as needed. For pain    [provider]  Ketoconazole-Hydrocortisone (KETOCON EX) Apply 1 application topically daily. For facial eczema    [provider]  levothyroxine (SYNTHROID, LEVOTHROID) 100 MCG tablet Take 100 mcg by mouth daily.    [provider]  losartan (COZAAR) 50 MG tablet Take 50 mg by mouth daily. 09/18/20   [provider]  Multiple Vitamin (MULTIVITAMIN WITH MINERALS) TABS Take 1 tablet by mouth daily.     [provider]  Omega-3 Fatty Acids (FISH OIL PO) Take 1 capsule by mouth daily.    [provider]  omeprazole (PRILOSEC) 20 MG capsule Take 1 capsule (20 mg total) by mouth 2 (two) times daily before a meal. 08/05/15   Everlene Farrier, PA-C  Sulfacetamide Sodium-Sulfur (SULFACETAMIDE SOD-SULFUR [facility] EX) Apply 1 application topically daily. For facial eczema    [provider]    Physical Exam: Constitutional: Moderately built and nourished. Vitals:   12/22/20 2324 12/23/20 0025 12/23/20 0300 12/23/20 0416  BP: 117/78 137/85 131/84 130/84  Pulse: 86 82 79 66  Resp: 16 16 (!) 24 18  Temp: 98.4  F (36.9 C)   (!) 97.5 F (36.4 C)  TempSrc: Oral   Oral  SpO2: 97% 99% 95% 95%  Weight:      Height:       Eyes: Anicteric no pallor. ENMT: No discharge from the ears eyes nose or mouth. Neck: No mass felt.  No neck rigidity. Respiratory: No rhonchi or crepitations. Cardiovascular: S1-S2 heard. Abdomen: Diffuse tenderness mostly in the lower quadrants.  No rigidity. Musculoskeletal: No edema. Skin: No rash. Neurologic: Alert awake oriented to time place and person.  Moves all extremities. Psychiatric: Appears normal.   Normal affect.   Labs on Admission: I have personally reviewed following labs and imaging studies  CBC: Recent Labs  Lab 12/22/20 1620  WBC 13.5*  HGB 13.6  HCT 42.0  MCV 93.5  PLT 255   Basic Metabolic Panel: Recent Labs  Lab 12/22/20 1620  NA 138  K 3.3*  CL 99  CO2 26  GLUCOSE 132*  BUN 9  CREATININE 0.78  CALCIUM 9.2   GFR: Estimated Creatinine Clearance: 63.8 mL/min (by C-G formula based on SCr of 0.78 mg/dL). Liver Function Tests: Recent Labs  Lab 12/22/20 1620  AST 16  ALT 15  ALKPHOS 95  BILITOT 0.7  PROT 7.3  ALBUMIN 3.8   Recent Labs  Lab 12/22/20 1620  LIPASE 24   No results for input(s): AMMONIA in the last 168 hours. Coagulation Profile: No results for input(s): INR, PROTIME in the last 168 hours. Cardiac Enzymes: No results for input(s): CKTOTAL, CKMB, CKMBINDEX, TROPONINI in the last 168 hours. BNP (last 3 results) No results for input(s): PROBNP in the last 8760 hours. HbA1C: No results for input(s): HGBA1C in the last 72 hours. CBG: No results for input(s): GLUCAP in the last 168 hours. Lipid Profile: No results for input(s): CHOL, HDL, LDLCALC, TRIG, CHOLHDL, LDLDIRECT in the last 72 hours. Thyroid Function Tests: No results for input(s): TSH, T4TOTAL, FREET4, T3FREE, THYROIDAB in the last 72 hours. Anemia Panel: No results for input(s): VITAMINB12, FOLATE, FERRITIN, TIBC, IRON, RETICCTPCT in the last 72 hours. Urine analysis:    Component Value Date/Time   COLORURINE STRAW (A) 12/22/2020 1620   APPEARANCEUR CLEAR 12/22/2020 1620   LABSPEC 1.005 12/22/2020 1620   PHURINE 7.0 12/22/2020 1620   GLUCOSEU NEGATIVE 12/22/2020 1620   HGBUR NEGATIVE 12/22/2020 1620   BILIRUBINUR NEGATIVE 12/22/2020 1620   KETONESUR NEGATIVE 12/22/2020 1620   PROTEINUR NEGATIVE 12/22/2020 1620   UROBILINOGEN 0.2 06/08/2012 2152   NITRITE NEGATIVE 12/22/2020 1620   LEUKOCYTESUR NEGATIVE 12/22/2020 1620   Sepsis  Labs: @LABRCNTIP (procalcitonin:4,lacticidven:4) )No results found for this or any previous visit (from the past 240 hour(s)).   Radiological Exams on Admission: CT ABDOMEN PELVIS WO CONTRAST  Result Date: 12/23/2020 CLINICAL DATA:  Abdominal pain, question of diverticulitis EXAM: CT ABDOMEN AND PELVIS WITHOUT CONTRAST TECHNIQUE: Multidetector CT imaging of the abdomen and pelvis was performed following the standard protocol without IV contrast. COMPARISON:  June 08, 2012 FINDINGS: Lower chest: The visualized heart size within normal limits. No pericardial fluid/thickening. No hiatal hernia. The visualized portions of the lungs are clear. Hepatobiliary: Although limited due to the lack of intravenous contrast, normal in appearance without gross focal abnormality. No evidence of calcified gallstones or biliary ductal dilatation. Pancreas:  Unremarkable.  No surrounding inflammatory changes. Spleen: Normal in size. Although limited due to the lack of intravenous contrast, normal in appearance. Adrenals/Urinary Tract: Both adrenal glands appear normal. The kidneys and collecting system appear normal without  evidence of urinary tract calculus or hydronephrosis. Bladder is unremarkable. Stomach/Bowel: The stomach, small bowel, are normal in appearance. There is a focal segment of proximal sigmoid colon with diffuse wall thickening diverticula and surrounding fat stranding changes. There is non loculated fluid seen along the posterior aspect of the proximal sigmoid colon. No definite free air is seen. Vascular/Lymphatic: There are no enlarged abdominal or pelvic lymph nodes. No significant gross vascular findings are present given the lack of intravenous contrast. Reproductive: The uterus and adnexa are unremarkable. Other: No evidence of abdominal wall mass or hernia. Musculoskeletal: No acute or significant osseous findings. IMPRESSION: Findings consistent with proximal sigmoid colonic diverticulitis. No loculated  fluid collections or free air. Electronically Signed   By: Jonna Clark M.D.   On: 12/23/2020 02:38    Assessment/Plan Principal Problem:   Acute diverticulitis Active Problems:   Essential hypertension   Hypothyroidism    1. Acute sigmoid diverticulitis -failed outpatient therapy still having persistent pain will admit for observation we will place patient on empiric antibiotics Zosyn has been ordered IV fluids pain relief medication n.p.o. for now.  Patient has had previous history of diverticulitis 15 years ago when patient underwent partial colectomy. 2. Hypertension we will keep patient on as needed IV hydralazine since patient is n.p.o. 3. Post ablative hypothyroidism we will keep patient on IV Synthroid. 4. Mild hypokalemia which we will replace and recheck.  COVID test is pending.   DVT prophylaxis: SCDs.  Avoiding anticoagulation if in case patient needs procedure. Code Status: Full code. Family Communication: Discussed with patient. Disposition Plan: Home. Consults called: None. Admission status: Observation.   Eduard Clos MD [facility] Pager [phone].  If 7PM-7AM, please contact night-coverage [email or web address] Password [facility]  12/23/2020, 5:33 AM

## 2020-12-24 DIAGNOSIS — K5792 Diverticulitis of intestine, part unspecified, without perforation or abscess without bleeding: Secondary | ICD-10-CM | POA: Diagnosis not present

## 2020-12-24 LAB — CBC
HCT: 33.5 % — ABNORMAL LOW (ref 36.0–46.0)
Hemoglobin: 11.3 g/dL — ABNORMAL LOW (ref 12.0–15.0)
MCH: 31.6 pg (ref 26.0–34.0)
MCHC: 33.7 g/dL (ref 30.0–36.0)
MCV: 93.6 fL (ref 80.0–100.0)
Platelets: 196 10*3/uL (ref 150–400)
RBC: 3.58 MIL/uL — ABNORMAL LOW (ref 3.87–5.11)
RDW: 12.9 % (ref 11.5–15.5)
WBC: 7.3 10*3/uL (ref 4.0–10.5)
nRBC: 0 % (ref 0.0–0.2)

## 2020-12-24 LAB — BASIC METABOLIC PANEL
Anion gap: 10 (ref 5–15)
BUN: 8 mg/dL (ref 8–23)
CO2: 25 mmol/L (ref 22–32)
Calcium: 8.8 mg/dL — ABNORMAL LOW (ref 8.9–10.3)
Chloride: 104 mmol/L (ref 98–111)
Creatinine, Ser: 0.86 mg/dL (ref 0.44–1.00)
GFR, Estimated: >60 mL/min (ref >60)
Glucose, Bld: 114 mg/dL — ABNORMAL HIGH (ref 70–99)
Potassium: 3.6 mmol/L (ref 3.5–5.1)
Sodium: 139 mmol/L (ref 135–145)

## 2020-12-24 LAB — GLUCOSE, CAPILLARY
Glucose-Capillary: 103 mg/dL — ABNORMAL HIGH (ref 70–99)
Glucose-Capillary: 105 mg/dL — ABNORMAL HIGH (ref 70–99)
Glucose-Capillary: 121 mg/dL — ABNORMAL HIGH (ref 70–99)
Glucose-Capillary: 123 mg/dL — ABNORMAL HIGH (ref 70–99)
Glucose-Capillary: 129 mg/dL — ABNORMAL HIGH (ref 70–99)

## 2020-12-24 MED ORDER — METRONIDAZOLE 500 MG PO TABS: 500.0000 mg | ORAL_TABLET | Freq: Every day | ORAL | 0 refills | Status: AC

## 2020-12-24 NOTE — Progress Notes (Signed)
PROGRESS NOTE    Kalsey Lull  EYC:[id number] DOB: [birth year] DOA: 12/22/2020 PCP: Patient, No Pcp Per   Chief Complaint  Patient presents with  . Abdominal Pain  Brief Narrative: 60 y.o. female was in her usual state of health until Wednesday/4 days ago when she developed left lower quadrant abdominal pain which progressively worsened in intensity. Patient relocated from [place] and has an upcoming appointment with a primary care physician Dr. Lupe Carney from [facility] in 1-1/2 weeks.  Since she does not have a primary care physician and her insurance would not cover a visit to urgent care, she used an app provided by her insurance and was advised to start ciprofloxacin and Flagyl.  She took 1 dose of both the antibiotics yesterday but since the abdominal pain continued decided to come to the ER. Patient has had history of perforation related to diverticulitis in 2006 for which she required surgical resection, status post anastomosis. Because of history of perforation in the past, patient expressed that she was scared of another complication and decided to come to the ER.  She has been taking ibuprofen at home for pain control. While waiting in the ER for 10 hours she took another dose of Cipro and Flagyl. Pt was admitted on IV Zosyn,seen by GI. Subjective: Alert awake comfortable.  Not in acute distress. Reports she will be going home tomorrow   Assessment & Plan:  Acute uncomplicated sigmoid diverticulitis, history of partial colectomy for diverticulitis and abscess 15 years.  Appreciate GI input on board continue IV Zosyn, advance diet today.  Leucocytosis resolved, is afebrile.  Essential hypertension: Blood pressure stable  Hypothyroidism: Continue home Synthroid  Hypokalemia: Replaced  Nutrition: Diet Order            Diet full liquid Room service appropriate? Yes; Fluid consistency: Thin  Diet effective now                Body mass index is 24.03 kg/m. DVT  prophylaxis: SCDs Start: 12/23/20 0531 Code Status:   Code Status: Full Code  Family Communication: plan of care discussed with patient at bedside.  Status is: Observation Remains hospitalized for continued IV antibiotics. Unsafe disposition unable to drive home today due to snow Dispo: The patient is from: Home              Anticipated d/c is to: Home              Anticipated d/c date is: 1 day              Patient currently is medically stable Consultants:see note  Procedures:see note  Culture/Microbiology No results found for: SDES, SPECREQUEST, CULT, REPTSTATUS  Other culture-see note  Medications: Scheduled Meds: . levothyroxine  50 mcg Intravenous Daily   Continuous Infusions: . piperacillin-tazobactam (ZOSYN)  IV 3.375 g (12/24/20 0701)    Antimicrobials: Anti-infectives (From admission, onward)   Start     Dose/Rate Route Frequency Ordered Stop   12/23/20 0800  piperacillin-tazobactam (ZOSYN) IVPB 3.375 g        3.375 g 12.5 mL/hr over 240 Minutes Intravenous Every 8 hours 12/23/20 0534     12/23/20 0230  piperacillin-tazobactam (ZOSYN) IVPB 3.375 g        3.375 g 100 mL/hr over 30 Minutes Intravenous  Once 12/23/20 0225 12/23/20 0327     Objective: Vitals: Today's Vitals   12/24/20 0242 12/24/20 0544 12/24/20 0807 12/24/20 1108  BP:  98/61  (!) 149/76  Pulse:  Marland Kitchen)  59  61  Resp:  18  18  Temp:  98.1 F (36.7 C)  98 F (36.7 C)  TempSrc:  Oral  Oral  SpO2:  95%  98%  Weight:      Height:      PainSc: Asleep  2      Intake/Output Summary (Last 24 hours) at 12/24/2020 1247 Last data filed at 12/24/2020 0800 Gross per 24 hour  Intake 1846.51 ml  Output 3 ml  Net 1843.51 ml   Filed Weights   12/22/20 1618  Weight: 63.5 kg   Weight change:   Intake/Output from previous day: 01/15 0701 - 01/16 0700 In: 1606.5 [P.O.:480; I.V.:1076.5; IV Piggyback:50] Out: 3 [Urine:3] Intake/Output this shift: Total I/O In: 240 [P.O.:240] Out: -  Filed Weights    12/22/20 1618  Weight: 63.5 kg    Examination: General exam: AAOx3 ,NAD, weak appearing. HEENT:Oral mucosa moist, Ear/Nose WNL grossly,dentition normal. Respiratory system: bilaterally clear,no wheezing or crackles,no use of accessory muscle, non tender. Cardiovascular system: S1 & S2 +, regular, No JVD. Gastrointestinal system: Abdomen soft, Tender on LLQ,ND, BS+. Nervous System:Alert, awake, moving extremities and grossly nonfocal Extremities: No edema, distal peripheral pulses palpable.  Skin: No rashes,no icterus. MSK: Normal muscle bulk,tone, power   Data Reviewed: I have personally reviewed following labs and imaging studies CBC: Recent Labs  Lab 12/22/20 1620 12/24/20 0126  WBC 13.5* 7.3  HGB 13.6 11.3*  HCT 42.0 33.5*  MCV 93.5 93.6  PLT 255 196   Basic Metabolic Panel: Recent Labs  Lab 12/22/20 1620 12/24/20 0126  NA 138 139  K 3.3* 3.6  CL 99 104  CO2 26 25  GLUCOSE 132* 114*  BUN 9 8  CREATININE 0.78 0.86  CALCIUM 9.2 8.8*   GFR: Estimated Creatinine Clearance: 59.3 mL/min (by C-G formula based on SCr of 0.86 mg/dL). Liver Function Tests: Recent Labs  Lab 12/22/20 1620  AST 16  ALT 15  ALKPHOS 95  BILITOT 0.7  PROT 7.3  ALBUMIN 3.8   Recent Labs  Lab 12/22/20 1620  LIPASE 24   No results for input(s): AMMONIA in the last 168 hours. Coagulation Profile: No results for input(s): INR, PROTIME in the last 168 hours. Cardiac Enzymes: No results for input(s): CKTOTAL, CKMB, CKMBINDEX, TROPONINI in the last 168 hours. BNP (last 3 results) No results for input(s): PROBNP in the last 8760 hours. HbA1C: No results for input(s): HGBA1C in the last 72 hours. CBG: Recent Labs  Lab 12/23/20 1323 12/24/20 0002 12/24/20 0540 12/24/20 1106  GLUCAP 116* 121* 129* 105*   Lipid Profile: No results for input(s): CHOL, HDL, LDLCALC, TRIG, CHOLHDL, LDLDIRECT in the last 72 hours. Thyroid Function Tests: No results for input(s): TSH, T4TOTAL,  FREET4, T3FREE, THYROIDAB in the last 72 hours. Anemia Panel: No results for input(s): VITAMINB12, FOLATE, FERRITIN, TIBC, IRON, RETICCTPCT in the last 72 hours. Sepsis Labs: No results for input(s): PROCALCITON, LATICACIDVEN in the last 168 hours.  Recent Results (from the past 240 hour(s))  SARS CORONAVIRUS 2 (TAT 6-24 HRS) Nasopharyngeal Nasopharyngeal Swab     Status: None   Collection Time: 12/23/20  2:52 AM   Specimen: Nasopharyngeal Swab  Result Value Ref Range Status   SARS Coronavirus 2 NEGATIVE NEGATIVE Final    Comment: (NOTE) SARS-CoV-2 target nucleic acids are NOT DETECTED.  The SARS-CoV-2 RNA is generally detectable in upper and lower respiratory specimens during the acute phase of infection. Negative results do not preclude SARS-CoV-2 infection, do not rule  out co-infections with other pathogens, and should not be used as the sole basis for treatment or other patient management decisions. Negative results must be combined with clinical observations, patient history, and epidemiological information. The expected result is Negative.  Fact Sheet for Patients: HairSlick.no  Fact Sheet for Healthcare Providers: quierodirigir.com  This test is not yet approved or cleared by the [place] FDA and  has been authorized for detection and/or diagnosis of SARS-CoV-2 by FDA under an Emergency Use Authorization (EUA). This EUA will remain  in effect (meaning this test can be used) for the duration of the COVID-19 declaration under Se ction 564(b)(1) of the Act, 21 U.S.C. section 360bbb-3(b)(1), unless the authorization is terminated or revoked sooner.  Performed at [facility], [address], [place], [place] [address]      Radiology Studies: CT ABDOMEN PELVIS WO CONTRAST  Result Date: 12/23/2020 CLINICAL DATA:  Abdominal pain, question of diverticulitis EXAM: CT ABDOMEN AND PELVIS WITHOUT CONTRAST  TECHNIQUE: Multidetector CT imaging of the abdomen and pelvis was performed following the standard protocol without IV contrast. COMPARISON:  June 08, 2012 FINDINGS: Lower chest: The visualized heart size within normal limits. No pericardial fluid/thickening. No hiatal hernia. The visualized portions of the lungs are clear. Hepatobiliary: Although limited due to the lack of intravenous contrast, normal in appearance without gross focal abnormality. No evidence of calcified gallstones or biliary ductal dilatation. Pancreas:  Unremarkable.  No surrounding inflammatory changes. Spleen: Normal in size. Although limited due to the lack of intravenous contrast, normal in appearance. Adrenals/Urinary Tract: Both adrenal glands appear normal. The kidneys and collecting system appear normal without evidence of urinary tract calculus or hydronephrosis. Bladder is unremarkable. Stomach/Bowel: The stomach, small bowel, are normal in appearance. There is a focal segment of proximal sigmoid colon with diffuse wall thickening diverticula and surrounding fat stranding changes. There is non loculated fluid seen along the posterior aspect of the proximal sigmoid colon. No definite free air is seen. Vascular/Lymphatic: There are no enlarged abdominal or pelvic lymph nodes. No significant gross vascular findings are present given the lack of intravenous contrast. Reproductive: The uterus and adnexa are unremarkable. Other: No evidence of abdominal wall mass or hernia. Musculoskeletal: No acute or significant osseous findings. IMPRESSION: Findings consistent with proximal sigmoid colonic diverticulitis. No loculated fluid collections or free air. Electronically Signed   By: Jonna Clark M.D.   On: 12/23/2020 02:38     LOS: 0 days   Lanae Boast, MD [facility]  12/24/2020, 12:47 PM

## 2020-12-25 DIAGNOSIS — K5792 Diverticulitis of intestine, part unspecified, without perforation or abscess without bleeding: Secondary | ICD-10-CM | POA: Diagnosis not present

## 2020-12-25 LAB — CBC
HCT: 37.8 % (ref 36.0–46.0)
Hemoglobin: 12.3 g/dL (ref 12.0–15.0)
MCH: 30.3 pg (ref 26.0–34.0)
MCHC: 32.5 g/dL (ref 30.0–36.0)
MCV: 93.1 fL (ref 80.0–100.0)
Platelets: 215 10*3/uL (ref 150–400)
RBC: 4.06 MIL/uL (ref 3.87–5.11)
RDW: 12.2 % (ref 11.5–15.5)
WBC: 6.9 10*3/uL (ref 4.0–10.5)
nRBC: 0 % (ref 0.0–0.2)

## 2020-12-25 LAB — BASIC METABOLIC PANEL
Anion gap: 10 (ref 5–15)
BUN: 9 mg/dL (ref 8–23)
CO2: 25 mmol/L (ref 22–32)
Calcium: 9.2 mg/dL (ref 8.9–10.3)
Chloride: 106 mmol/L (ref 98–111)
Creatinine, Ser: 0.85 mg/dL (ref 0.44–1.00)
GFR, Estimated: >60 mL/min (ref >60)
Glucose, Bld: 103 mg/dL — ABNORMAL HIGH (ref 70–99)
Potassium: 4 mmol/L (ref 3.5–5.1)
Sodium: 141 mmol/L (ref 135–145)

## 2020-12-25 LAB — GLUCOSE, CAPILLARY
Glucose-Capillary: 95 mg/dL (ref 70–99)
Glucose-Capillary: 88 mg/dL (ref 70–99)

## 2020-12-25 MED ORDER — FLORANEX PO PACK: 1.0000 g | PACK | Freq: Two times a day (BID) | ORAL | 0 refills | Status: AC

## 2020-12-25 NOTE — Progress Notes (Signed)
Pt IV removed, catheter intact. Pt has all belongings and understands d/c instructions. Pt d/c via wheelchair by RN.  

## 2020-12-25 NOTE — Discharge Summary (Signed)
Physician Discharge Summary  Kaylee Carter KZL:[id number] DOB: Mar 09, 1961 DOA: 12/22/2020  PCP: Patient, No Pcp Per  Admit date: 12/22/2020 Discharge date: 12/25/2020  Admitted From: home Disposition:  home  Recommendations for Outpatient Follow-up:  1. Follow up with PCP and GI in 1-2 weeks 2. Please obtain BMP/CBC in one week 3. Please follow up on the following pending results:  Home Health:HOME  Equipment/Devices: HOME  Discharge Condition: Stable Code Status:   Code Status: Full Code Diet recommendation:  Diet Order            Diet general           Diet regular Room service appropriate? Yes; Fluid consistency: Thin  Diet effective now                  Brief/Interim Summary: 60 y.o.femalewas in her usual state of health until Wednesday/4 days ago when she developed left lower quadrant abdominal pain which progressively worsened in intensity. Patient relocated from [place] and has an upcoming appointment with a primary care physician Dr. Lupe Carney from [facility] in 1-1/2 weeks.Since she does not have a primary care physician and her insurance would not cover a visit to urgent care,she used an app provided by her insurance and was advised to start ciprofloxacin and Flagyl.She took 1 dose of both the antibiotics yesterday but since the abdominal pain continued decided to come to the ER. Patient has had history of perforation related to diverticulitis in 2006 for which she required surgical resection, status post anastomosis. Because of history of perforation in the past, patient expressed that she was scared of another complication and decided to come to the ER.She has been taking ibuprofen at home for pain control. While waiting in the ER for 10 hours she took another dose of Cipro and Flagyl. Pt was admitted on IV Zosyn,seen by GI Patient has managed medically diet was slowly advanced.  This time tolerating regular diet some mild loose stool, she feels well and  okay for discharge home today.  She will follow-up with GI and discuss with GI if anything needed in the future Flagyl has been increased to 3 times daily.  She will also continue with Cipro.  Discharge Diagnoses:   Acute uncomplicated sigmoid diverticulitis, history of partial colectomy for diverticulitis and abscess 15 years.  Appreciate GI input on board treated with IV Zosyn, will discharge on 10 days of Cipro Flagyl which she already has at home, advised to increase Flagyl to every 8 and prescription provided.   Essential hypertension:  Stable  Hypothyroidism:  On home Synthroid  Hypokalemia: WAS Replaced Consults:  GI  Subjective: Alert awake oriented, tolerating diet no nausea vomiting fever chills.  Abdominal pain has resolved.  Discharge Exam: Vitals:   12/24/20 2350 12/25/20 0554  BP: 120/82 100/62  Pulse: 60 66  Resp: 20 18  Temp: 97.8 F (36.6 C) 98.7 F (37.1 C)  SpO2: 98% 92%   General: Pt is alert, awake, not in acute distress Cardiovascular: RRR, S1/S2 +, no rubs, no gallops Respiratory: CTA bilaterally, no wheezing, no rhonchi Abdominal: Soft, NT, ND, bowel sounds + Extremities: no edema, no cyanosis  Discharge Instructions  Discharge Instructions    Diet general   Complete by: As directed    Discharge instructions   Complete by: As directed    Please call call MD or return to ER for similar or worsening recurring problem that brought you to hospital or if any fever,nausea/vomiting,abdominal pain, uncontrolled pain, chest  pain,  shortness of breath or any other alarming symptoms.  Please follow-up your doctor as instructed in a week time and call the office for appointment.  Please avoid alcohol, smoking, or any other illicit substance and maintain healthy habits including taking your regular medications as prescribed.  You were cared for by a hospitalist during your hospital stay. If you have any questions about your discharge medications or the  care you received while you were in the hospital after you are discharged, you can call the unit and ask to speak with the hospitalist on call if the hospitalist that took care of you is not available.  Once you are discharged, your primary care physician will handle any further medical issues. Please note that NO REFILLS for any discharge medications will be authorized once you are discharged, as it is imperative that you return to your primary care physician (or establish a relationship with a primary care physician if you do not have one) for your aftercare needs so that they can reassess your need for medications and monitor your lab values   Increase activity slowly   Complete by: As directed      Allergies as of 12/25/2020      Reactions   Ivp Dye [iodinated Diagnostic Agents] Swelling   Lactose Intolerance (gi)       Medication List    TAKE these medications   cholecalciferol 25 MCG (1000 UNIT) tablet Commonly known as: VITAMIN D3 Take 3,000 Units by mouth daily.   ciprofloxacin 500 MG tablet Commonly known as: CIPRO Take 500 mg by mouth 2 (two) times daily.   clobetasol cream 0.05 % Commonly known as: TEMOVATE Apply topically 2 (two) times daily.   FISH OIL PO Take 1-3 capsules by mouth daily.   hydrochlorothiazide 12.5 MG tablet Commonly known as: HYDRODIURIL Take 12.5 mg by mouth daily.   ibuprofen 200 MG tablet Commonly known as: ADVIL Take 400 mg by mouth every 6 (six) hours as needed for fever or mild pain. For pain   lactobacillus Pack Take 1 packet (1 g total) by mouth 2 (two) times daily for 10 days.   levothyroxine 100 MCG tablet Commonly known as: SYNTHROID Take 100 mcg by mouth daily.   losartan 50 MG tablet Commonly known as: COZAAR Take 50 mg by mouth daily.   MAGNESIUM OXIDE PO Take 1 tablet by mouth at bedtime.   melatonin 1 MG Tabs tablet Take 1 mg by mouth at bedtime.   metroNIDAZOLE 500 MG tablet Commonly known as: FLAGYL Take 500 mg  by mouth 2 (two) times daily. What changed: Another medication with the same name was added. Make sure you understand how and when to take each.   metroNIDAZOLE 500 MG tablet Commonly known as: Flagyl Take 1 tablet (500 mg total) by mouth daily for 10 doses. Take 500 mg daily along with 500 mg BID at home- for total 500 mg q8hr What changed: You were already taking a medication with the same name, and this prescription was added. Make sure you understand how and when to take each.   multivitamin with minerals Tabs tablet Take 1 tablet by mouth daily.   Sulfacetamide Sodium (Acne) 10 % Lotn Apply 1 application topically daily.       Follow-up Information    Kerin SalenKarki, Arya, MD. Call.   Specialty: Gastroenterology Contact information: [address] [place] [address] [phone]  Allergies  Allergen Reactions  . Ivp Dye [Iodinated Diagnostic Agents] Swelling  . Lactose Intolerance (Gi)     The results of significant diagnostics from this hospitalization (including imaging, microbiology, ancillary and laboratory) are listed below for reference.    Microbiology: Recent Results (from the past 240 hour(s))  SARS CORONAVIRUS 2 (TAT 6-24 HRS) Nasopharyngeal Nasopharyngeal Swab     Status: None   Collection Time: 12/23/20  2:52 AM   Specimen: Nasopharyngeal Swab  Result Value Ref Range Status   SARS Coronavirus 2 NEGATIVE NEGATIVE Final    Comment: (NOTE) SARS-CoV-2 target nucleic acids are NOT DETECTED.  The SARS-CoV-2 RNA is generally detectable in upper and lower respiratory specimens during the acute phase of infection. Negative results do not preclude SARS-CoV-2 infection, do not rule out co-infections with other pathogens, and should not be used as the sole basis for treatment or other patient management decisions. Negative results must be combined with clinical observations, patient history, and epidemiological information. The  expected result is Negative.  Fact Sheet for Patients: HairSlick.no  Fact Sheet for Healthcare Providers: quierodirigir.com  This test is not yet approved or cleared by the [place] FDA and  has been authorized for detection and/or diagnosis of SARS-CoV-2 by FDA under an Emergency Use Authorization (EUA). This EUA will remain  in effect (meaning this test can be used) for the duration of the COVID-19 declaration under Se ction 564(b)(1) of the Act, 21 U.S.C. section 360bbb-3(b)(1), unless the authorization is terminated or revoked sooner.  Performed at [facility], [address] [place], [place] [address]     Procedures/Studies: CT ABDOMEN PELVIS WO CONTRAST  Result Date: 12/23/2020 CLINICAL DATA:  Abdominal pain, question of diverticulitis EXAM: CT ABDOMEN AND PELVIS WITHOUT CONTRAST TECHNIQUE: Multidetector CT imaging of the abdomen and pelvis was performed following the standard protocol without IV contrast. COMPARISON:  June 08, 2012 FINDINGS: Lower chest: The visualized heart size within normal limits. No pericardial fluid/thickening. No hiatal hernia. The visualized portions of the lungs are clear. Hepatobiliary: Although limited due to the lack of intravenous contrast, normal in appearance without gross focal abnormality. No evidence of calcified gallstones or biliary ductal dilatation. Pancreas:  Unremarkable.  No surrounding inflammatory changes. Spleen: Normal in size. Although limited due to the lack of intravenous contrast, normal in appearance. Adrenals/Urinary Tract: Both adrenal glands appear normal. The kidneys and collecting system appear normal without evidence of urinary tract calculus or hydronephrosis. Bladder is unremarkable. Stomach/Bowel: The stomach, small bowel, are normal in appearance. There is a focal segment of proximal sigmoid colon with diffuse wall thickening diverticula and surrounding fat  stranding changes. There is non loculated fluid seen along the posterior aspect of the proximal sigmoid colon. No definite free air is seen. Vascular/Lymphatic: There are no enlarged abdominal or pelvic lymph nodes. No significant gross vascular findings are present given the lack of intravenous contrast. Reproductive: The uterus and adnexa are unremarkable. Other: No evidence of abdominal wall mass or hernia. Musculoskeletal: No acute or significant osseous findings. IMPRESSION: Findings consistent with proximal sigmoid colonic diverticulitis. No loculated fluid collections or free air. Electronically Signed   By: Jonna Clark M.D.   On: 12/23/2020 02:38    Labs: BNP (last 3 results) No results for input(s): BNP in the last 8760 hours. Basic Metabolic Panel: Recent Labs  Lab 12/22/20 1620 12/24/20 0126 12/25/20 0320  NA 138 139 141  K 3.3* 3.6 4.0  CL 99 104 106  CO2 26 25  25  GLUCOSE 132* 114* 103*  BUN 9 8 9   CREATININE 0.78 0.86 0.85  CALCIUM 9.2 8.8* 9.2   Liver Function Tests: Recent Labs  Lab 12/22/20 1620  AST 16  ALT 15  ALKPHOS 95  BILITOT 0.7  PROT 7.3  ALBUMIN 3.8   Recent Labs  Lab 12/22/20 1620  LIPASE 24   No results for input(s): AMMONIA in the last 168 hours. CBC: Recent Labs  Lab 12/22/20 1620 12/24/20 0126 12/25/20 0320  WBC 13.5* 7.3 6.9  HGB 13.6 11.3* 12.3  HCT 42.0 33.5* 37.8  MCV 93.5 93.6 93.1  PLT 255 196 215   Cardiac Enzymes: No results for input(s): CKTOTAL, CKMB, CKMBINDEX, TROPONINI in the last 168 hours. BNP: Invalid input(s): POCBNP CBG: Recent Labs  Lab 12/24/20 0540 12/24/20 1106 12/24/20 1902 12/24/20 2349 12/25/20 0551  GLUCAP 129* 105* 103* 123* 95   D-Dimer No results for input(s): DDIMER in the last 72 hours. Hgb A1c No results for input(s): HGBA1C in the last 72 hours. Lipid Profile No results for input(s): CHOL, HDL, LDLCALC, TRIG, CHOLHDL, LDLDIRECT in the last 72 hours. Thyroid function studies No  results for input(s): TSH, T4TOTAL, T3FREE, THYROIDAB in the last 72 hours.  Invalid input(s): FREET3 Anemia work up No results for input(s): VITAMINB12, FOLATE, FERRITIN, TIBC, IRON, RETICCTPCT in the last 72 hours. Urinalysis    Component Value Date/Time   COLORURINE STRAW (A) 12/22/2020 1620   APPEARANCEUR CLEAR 12/22/2020 1620   LABSPEC 1.005 12/22/2020 1620   PHURINE 7.0 12/22/2020 1620   GLUCOSEU NEGATIVE 12/22/2020 1620   HGBUR NEGATIVE 12/22/2020 1620   BILIRUBINUR NEGATIVE 12/22/2020 1620   KETONESUR NEGATIVE 12/22/2020 1620   PROTEINUR NEGATIVE 12/22/2020 1620   UROBILINOGEN 0.2 06/08/2012 2152   NITRITE NEGATIVE 12/22/2020 1620   LEUKOCYTESUR NEGATIVE 12/22/2020 1620   Sepsis Labs Invalid input(s): PROCALCITONIN,  WBC,  LACTICIDVEN Microbiology Recent Results (from the past 240 hour(s))  SARS CORONAVIRUS 2 (TAT 6-24 HRS) Nasopharyngeal Nasopharyngeal Swab     Status: None   Collection Time: 12/23/20  2:52 AM   Specimen: Nasopharyngeal Swab  Result Value Ref Range Status   SARS Coronavirus 2 NEGATIVE NEGATIVE Final    Comment: (NOTE) SARS-CoV-2 target nucleic acids are NOT DETECTED.  The SARS-CoV-2 RNA is generally detectable in upper and lower respiratory specimens during the acute phase of infection. Negative results do not preclude SARS-CoV-2 infection, do not rule out co-infections with other pathogens, and should not be used as the sole basis for treatment or other patient management decisions. Negative results must be combined with clinical observations, patient history, and epidemiological information. The expected result is Negative.  Fact Sheet for Patients: 12/25/20  Fact Sheet for Healthcare Providers: HairSlick.no  This test is not yet approved or cleared by the [place] FDA and  has been authorized for detection and/or diagnosis of SARS-CoV-2 by FDA under an Emergency Use  Authorization (EUA). This EUA will remain  in effect (meaning this test can be used) for the duration of the COVID-19 declaration under Se ction 564(b)(1) of the Act, 21 U.S.C. section 360bbb-3(b)(1), unless the authorization is terminated or revoked sooner.  Performed at [facility], [address] [place], [place]      Time coordinating discharge: 25 minutes  SIGNED: [id number], MD  [facility] 12/25/2020, 8:44 AM  If 7PM-7AM, please contact night-coverage [email or web address]

## 2021-01-11 ENCOUNTER — Encounter: Payer: PRIVATE HEALTH INSURANCE | Admitting: Obstetrics and Gynecology

## 2021-05-03 ENCOUNTER — Encounter: Payer: Self-pay | Admitting: Nurse Practitioner

## 2021-08-24 DIAGNOSIS — Z23 Encounter for immunization: Secondary | ICD-10-CM | POA: Diagnosis not present

## 2021-08-29 ENCOUNTER — Ambulatory Visit: Payer: No Typology Code available for payment source

## 2021-10-03 ENCOUNTER — Ambulatory Visit: Payer: Self-pay

## 2021-10-03 ENCOUNTER — Other Ambulatory Visit: Payer: Self-pay

## 2021-10-03 DIAGNOSIS — Z23 Encounter for immunization: Secondary | ICD-10-CM

## 2021-10-08 ENCOUNTER — Ambulatory Visit: Payer: 59 | Admitting: Nurse Practitioner

## 2021-10-27 IMAGING — CT CT ABD-PELV W/O CM
2 of 4 series · 16 of 46 positions shown, 18 images · non-contrast
Comparison: June 08, 2012

CLINICAL DATA: Abdominal pain, question of diverticulitis

EXAM:
CT ABDOMEN AND PELVIS WITHOUT CONTRAST
TECHNIQUE: Multidetector CT imaging of the abdomen and pelvis was performed
following the standard protocol without IV contrast.

[Series 3: ap without · axial · non-contrast · 0.77mm/px · z∈[-528,-183]mm · 13 of 79 slices shown, 15 images]
[im 5/79  soft-tissue]
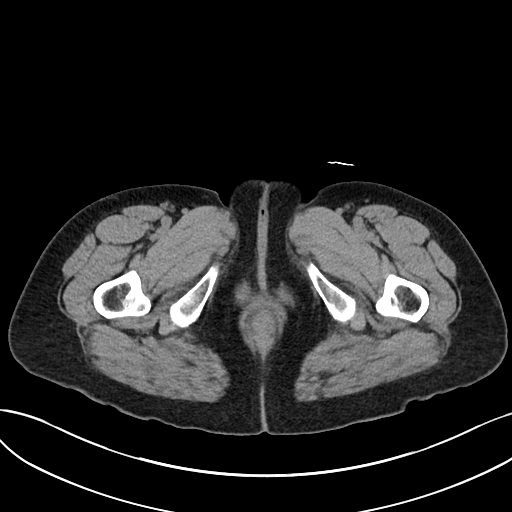
[im 5/79  bone]
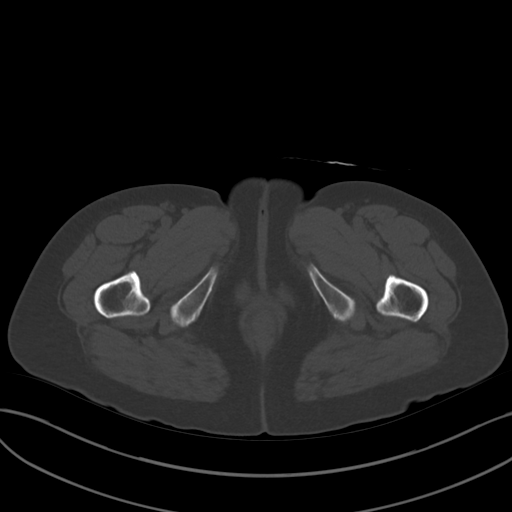
[im 10/79  soft-tissue]
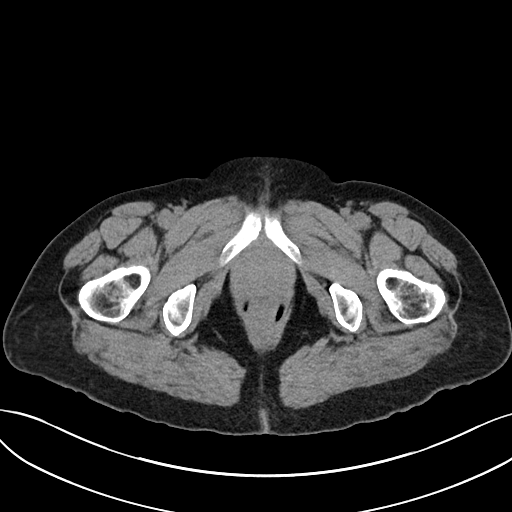
[im 19/79  soft-tissue]
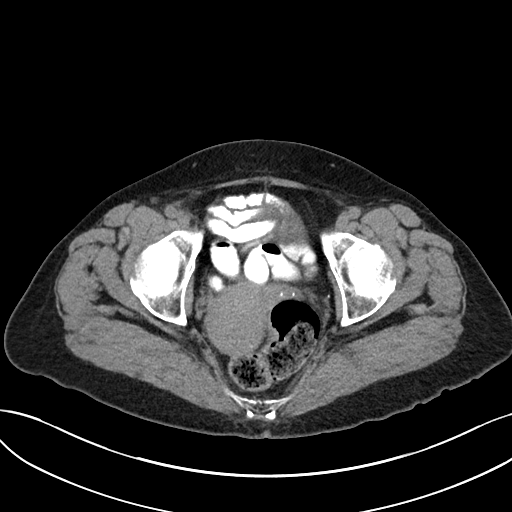
[im 23/79  soft-tissue]
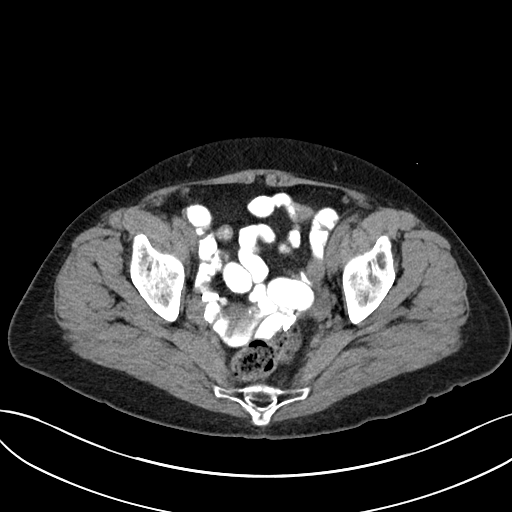
[im 28/79  soft-tissue]
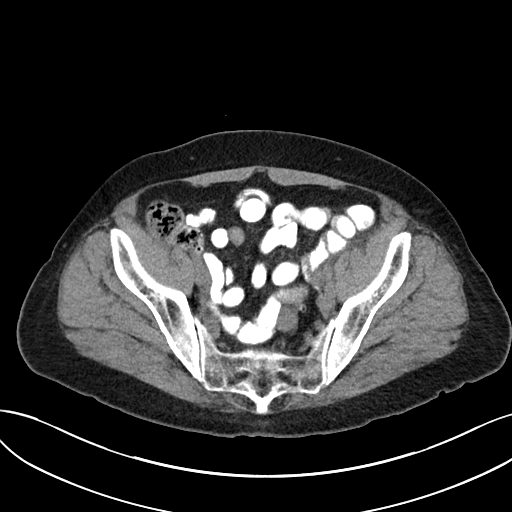
[im 33/79  soft-tissue]
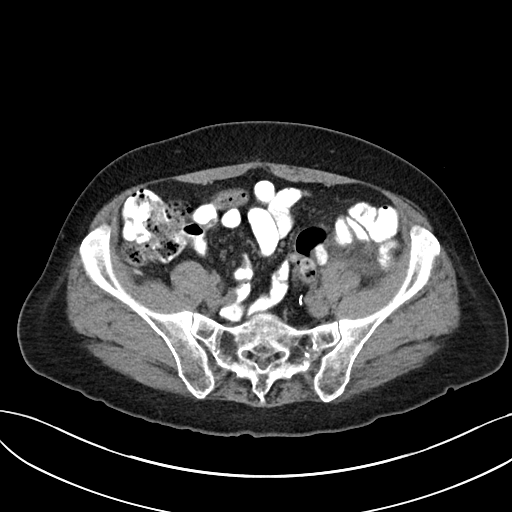
[im 42/79  soft-tissue]
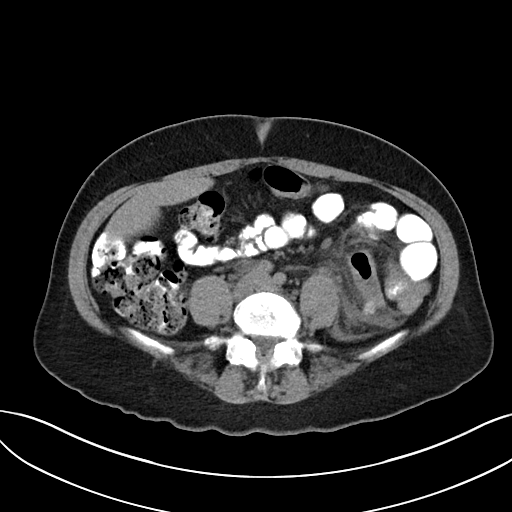
[im 46/79  soft-tissue]
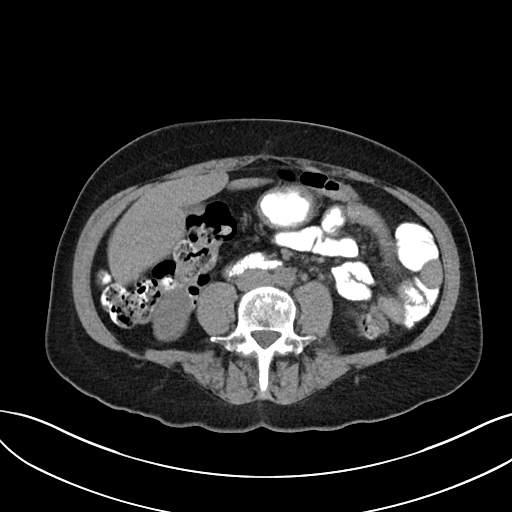
[im 51/79  soft-tissue]
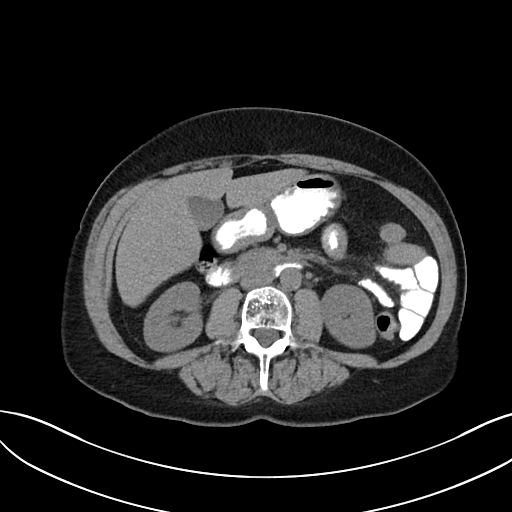
[im 51/79  bone]
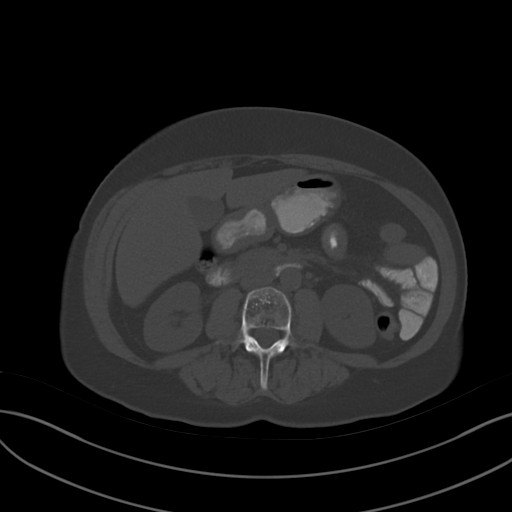
[im 56/79  soft-tissue]
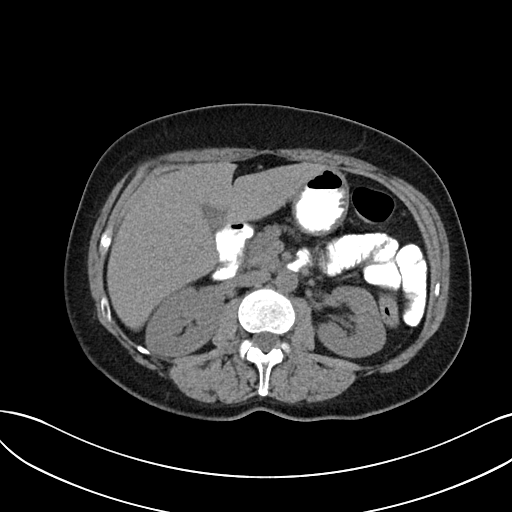
[im 60/79  soft-tissue]
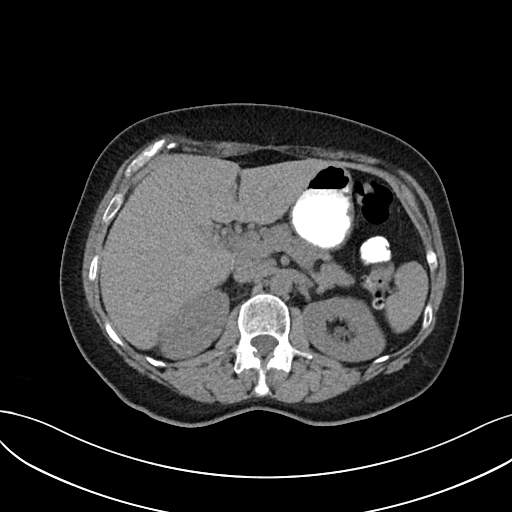
[im 69/79  soft-tissue]
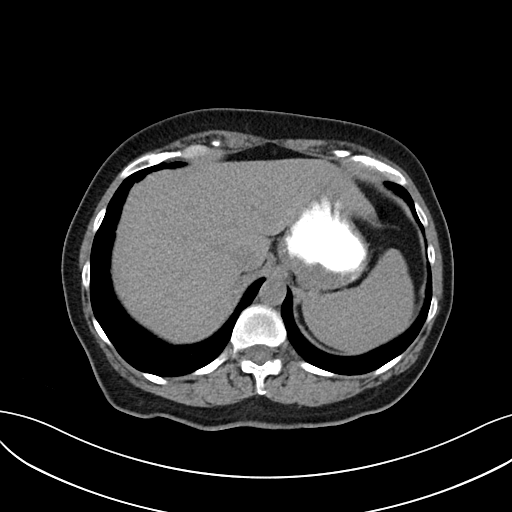
[im 74/79  soft-tissue]
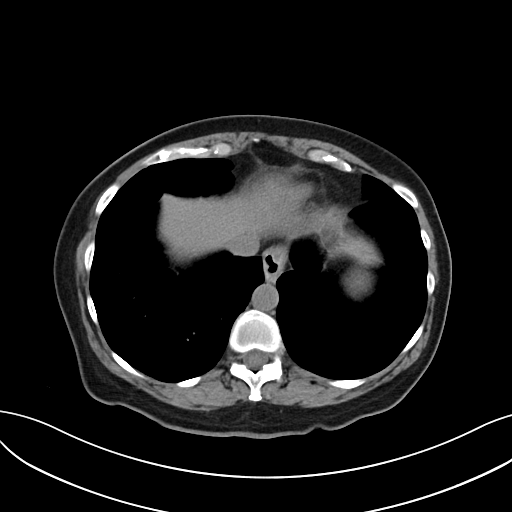

[Series 6: cor · coronal · 0.78mm/px · 3 of 87 slices shown]
[im 29/87  soft-tissue]
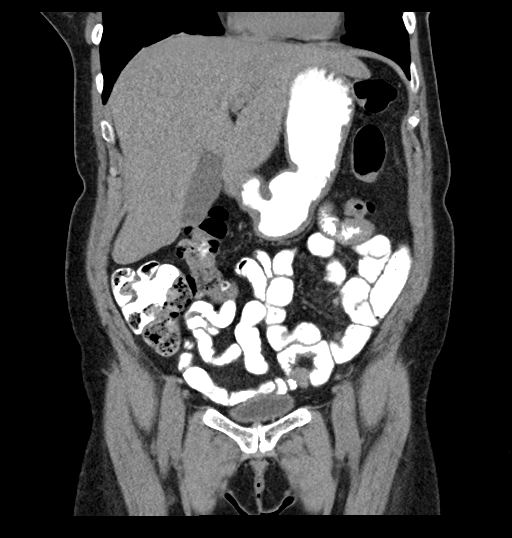
[im 39/87  soft-tissue]
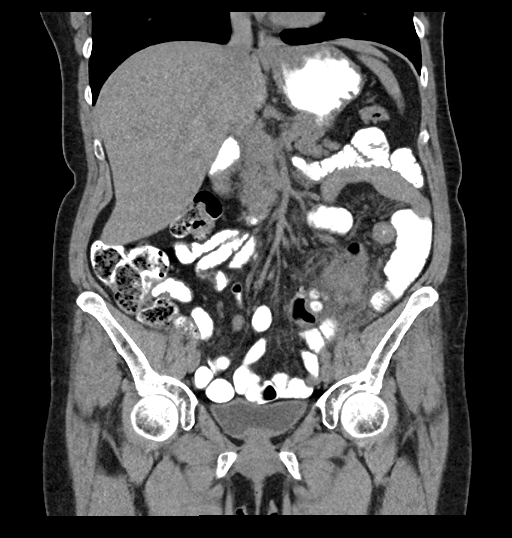
[im 48/87  soft-tissue]
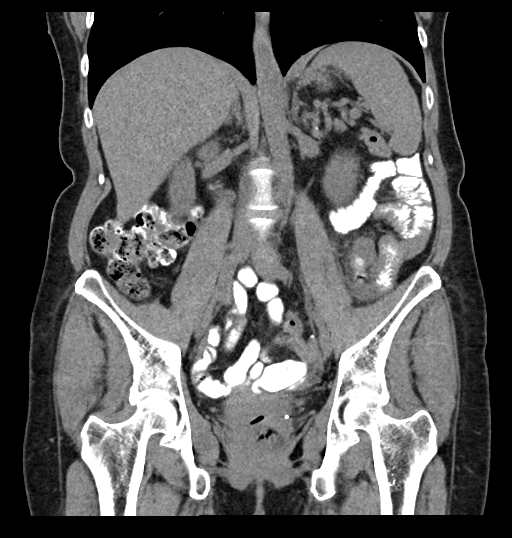

[16 of 46 positions shown; findings below may reference images not displayed]

FINDINGS: Lower chest: The visualized heart size within normal limits. No
pericardial fluid/thickening.

No hiatal hernia.

The visualized portions of the lungs are clear.

Hepatobiliary: Although limited due to the lack of intravenous
contrast, normal in appearance without gross focal abnormality. No
evidence of calcified gallstones or biliary ductal dilatation.

Pancreas:  Unremarkable.  No surrounding inflammatory changes.

Spleen: Normal in size. Although limited due to the lack of
intravenous contrast, normal in appearance.

Adrenals/Urinary Tract: Both adrenal glands appear normal. The
kidneys and collecting system appear normal without evidence of
urinary tract calculus or hydronephrosis. Bladder is unremarkable.

Stomach/Bowel: The stomach, small bowel, are normal in appearance.
There is a focal segment of proximal sigmoid colon with diffuse wall
thickening diverticula and surrounding fat stranding changes. There
is non loculated fluid seen along the posterior aspect of the
proximal sigmoid colon. No definite free air is seen.

Vascular/Lymphatic: There are no enlarged abdominal or pelvic lymph
nodes. No significant gross vascular findings are present given the
lack of intravenous contrast.

Reproductive: The uterus and adnexa are unremarkable.

Other: No evidence of abdominal wall mass or hernia.

Musculoskeletal: No acute or significant osseous findings.
IMPRESSION: Findings consistent with proximal sigmoid colonic diverticulitis. No
loculated fluid collections or free air.

## 2021-11-21 ENCOUNTER — Encounter: Payer: Self-pay | Admitting: Nurse Practitioner

## 2021-11-21 ENCOUNTER — Ambulatory Visit (INDEPENDENT_AMBULATORY_CARE_PROVIDER_SITE_OTHER): Payer: BC Managed Care – PPO | Admitting: Nurse Practitioner

## 2021-11-21 ENCOUNTER — Other Ambulatory Visit: Payer: Self-pay

## 2021-11-21 VITALS — BP 118/74 | Ht 63.5 in | Wt 167.0 lb

## 2021-11-21 DIAGNOSIS — Z780 Asymptomatic menopausal state: Secondary | ICD-10-CM | POA: Diagnosis not present

## 2021-11-21 DIAGNOSIS — R29890 Loss of height: Secondary | ICD-10-CM

## 2021-11-21 DIAGNOSIS — L900 Lichen sclerosus et atrophicus: Secondary | ICD-10-CM | POA: Diagnosis not present

## 2021-11-21 DIAGNOSIS — Z01419 Encounter for gynecological examination (general) (routine) without abnormal findings: Secondary | ICD-10-CM

## 2021-11-21 MED ORDER — CLOBETASOL PROPIONATE 0.05 % EX OINT: 1.0000 "application " | TOPICAL_OINTMENT | Freq: Two times a day (BID) | CUTANEOUS | 2 refills | Status: DC

## 2021-11-21 MED ORDER — CLOBETASOL PROPIONATE 0.05 % EX OINT: 1.0000 "application " | TOPICAL_OINTMENT | CUTANEOUS | 2 refills | Status: AC

## 2021-11-21 NOTE — Progress Notes (Signed)
Kaylee Carter [birth year] [id number]   History:  60 y.o. G1P1+1 adopted presents for annual exam. Complains of intermittent vulvar and rectal itching. Uses Nystatin with little improvement. Postmenopausal - no HRT, no bleeding. Uses Replens twice weekly with good relief of vaginal dryness. Normal pap and mammogram history. Hypothyroidism, HTN, HLD managed by PCP.   Gynecologic History No LMP recorded. Patient is postmenopausal.   Contraception: post menopausal status and tubal ligation Sexually active:Yes  Health maintenance Last Pap: 10/05/2020. Results were: Normal, 5-year repeat Last mammogram: 05/03/2021. Results were: Normal Last colonoscopy: 2013. Results were: Normal, 10-year recall Last Dexa: Never  Past medical history, past surgical history, family history and social history were all reviewed and documented in the EPIC chart. Working at [employer]. Daughter in [place], [place] moving back home. Sister with history of breast cancer. Father and sister with diabetes.   ROS:  A ROS was performed and pertinent positives and negatives are included.  Exam:  Vitals:   11/21/21 0800  BP: 118/74  Weight: 167 lb (75.8 kg)  Height: 5' 3.5" (1.613 m)    Body mass index is 29.12 kg/m.  General appearance:  Normal Thyroid:  Symmetrical, normal in size, without palpable masses or nodularity. Respiratory  Auscultation:  Clear without wheezing or rhonchi Cardiovascular  Auscultation:  Regular rate, without rubs, murmurs or gallops  Edema/varicosities:  Not grossly evident Abdominal  Soft,nontender, without masses, guarding or rebound.  Liver/spleen:  No organomegaly noted  Hernia:  None appreciated  Skin  Inspection:  Grossly normal   Breasts: Examined lying and sitting.   Right: Without masses, retractions, discharge or axillary adenopathy.   Left: Without masses, retractions, discharge or axillary adenopathy. Genitourinary   Inguinal/mons:  Normal without inguinal  adenopathy  External genitalia:  Redness with some excoriation. Pearly white appearance consistent with lichen sclerosus  BUS/Urethra/Skene's glands:  Normal  Vagina:  Normal appearing with normal color and discharge, no lesions  Cervix:  Normal appearing without discharge or lesions  Uterus:  Normal in size, shape and contour.  Midline and mobile, nontender  Adnexa/parametria:     Rt: Normal in size, without masses or tenderness.   Lt: Normal in size, without masses or tenderness.  Anus and perineum: Large non-bleeding hemorrhoids. White pearly appearance consistent with LS  Digital rectal exam: Normal sphincter tone without palpated masses or tenderness  Patient informed chaperone available to be present for breast and pelvic exam. Patient has requested no chaperone to be present. Patient has been advised what will be completed during breast and pelvic exam.   Assessment/Plan:  60 y.o. G1P1+1 adopted here for annual exam.   Well female exam with routine gynecological exam - Education provided on SBEs, importance of preventative screenings, current guidelines, high calcium diet, regular exercise, and multivitamin daily. Labs with PCP.   Postmenopausal - Plan: DG Bone Density. No HRT, no bleeding.   Lichen sclerosus - Plan: clobetasol ointment (TEMOVATE) 0.05 %. Exam consistent with lichen sclerosus changes. Recommend using nightly x 2 weeks, then every other night x 2 weeks, then twice weekly.  Height loss - Plan: DG Bone Density. Loss of 1.5 inches from 5'5" to now 78f'3.5".   Screening for cervical cancer - Normal Pap history. Will repeat at 5-year interval per guidelines.   Screening for breast cancer - Normal mammogram history. Normal breast exam today.  Continue annual screenings. Sister diagnosed with breast cancer in 2020.  Screening for colon cancer - 2013 colonoscopy. Due for repeat next year.   Follow-up  in 1 year for annual.     [name] [facility], 9:00 AM  11/21/2021

## 2022-01-02 DIAGNOSIS — E039 Hypothyroidism, unspecified: Secondary | ICD-10-CM | POA: Diagnosis not present

## 2022-01-02 DIAGNOSIS — E7800 Pure hypercholesterolemia, unspecified: Secondary | ICD-10-CM | POA: Diagnosis not present

## 2022-01-02 DIAGNOSIS — I10 Essential (primary) hypertension: Secondary | ICD-10-CM | POA: Diagnosis not present

## 2022-01-02 DIAGNOSIS — K219 Gastro-esophageal reflux disease without esophagitis: Secondary | ICD-10-CM | POA: Diagnosis not present

## 2022-01-02 DIAGNOSIS — L290 Pruritus ani: Secondary | ICD-10-CM | POA: Diagnosis not present

## 2022-01-03 DIAGNOSIS — H179 Unspecified corneal scar and opacity: Secondary | ICD-10-CM | POA: Diagnosis not present

## 2022-01-03 DIAGNOSIS — H26493 Other secondary cataract, bilateral: Secondary | ICD-10-CM | POA: Diagnosis not present

## 2022-01-03 DIAGNOSIS — H524 Presbyopia: Secondary | ICD-10-CM | POA: Diagnosis not present

## 2022-01-03 DIAGNOSIS — H04123 Dry eye syndrome of bilateral lacrimal glands: Secondary | ICD-10-CM | POA: Diagnosis not present

## 2022-01-03 DIAGNOSIS — Z961 Presence of intraocular lens: Secondary | ICD-10-CM | POA: Diagnosis not present

## 2022-01-08 ENCOUNTER — Other Ambulatory Visit: Payer: Self-pay

## 2022-01-08 ENCOUNTER — Other Ambulatory Visit: Payer: Self-pay | Admitting: Nurse Practitioner

## 2022-01-08 ENCOUNTER — Ambulatory Visit (INDEPENDENT_AMBULATORY_CARE_PROVIDER_SITE_OTHER): Payer: BC Managed Care – PPO

## 2022-01-08 DIAGNOSIS — Z780 Asymptomatic menopausal state: Secondary | ICD-10-CM

## 2022-01-08 DIAGNOSIS — M810 Age-related osteoporosis without current pathological fracture: Secondary | ICD-10-CM

## 2022-01-08 DIAGNOSIS — R29890 Loss of height: Secondary | ICD-10-CM

## 2022-02-05 ENCOUNTER — Ambulatory Visit: Payer: BC Managed Care – PPO

## 2022-02-05 ENCOUNTER — Other Ambulatory Visit: Payer: Self-pay

## 2022-02-05 NOTE — Progress Notes (Signed)
Nutrition Consult: 02/05/22  CC: I had a recent bone density scan that was not good results.  I want to know what I should be eating to enable me to not take medications for it.  I have lost 1.5 inches over the last few years.  HT: 63 in  WT: 167 BMI: 29.12 Post-menopausal Medications include: Losartan/HCTZ , Levothyroxine, and Vitamin D 3,000 unit Currently taking a medication for acid reflux. Has been told in the past that she test positive for gluten intolerance.  Activity: Walking.  Has purchased a walking pad to use with her standing desk.  Yesterday walked 9.5 miles on the pad just at work.  Has no planned exercise outside walking at work.    Diet Recall:  Breakfast: 6-6:30  coffee with milk and sugar.  Gluten free toast and an egg. OR Over night oats with Chia and flax seeds., 2% milk.  OR grits and eggs.  Snack: Mid-morning snack of maybe dried apricots and almonds or a yogurt.  Lunch: In cafeteria.  Will have international meat and veggies bar with a salad and fruit. Meals often include rice or pasta as a base.  Snack: Salad from lunch OR fruit or yogurt.  Dinner: Last evening meal was chicken sausage with mashed potatoes.  Or meal of grilled asparagus and onions with salmon. Some evenings will have an alcoholic drink with meal.    Recommendation: -Look to get some resistance exercise in your routine.  Need the stretching of muscles at long bone insertion sites.  Consider stretching exercises, resistance exercises, or pilates class. -Keep the oats whole grains and gluten free.  Always look for fiber. -Keep up your vegetables.  Especially the non-starchy green and leafy(spinach, kale, lettuces) all are higher in vitamin K. -Keep using your beans, peas, lentils. -Use fattier fish with the Omega 3 fats. -Keep walking but add resistance.  Plan:  Start the gym and adding resistance. Increase my greens. Talk to the MD regarding my reflux med.  F/U: As you wish.  Call [facility]  for an appointment.

## 2022-02-18 ENCOUNTER — Ambulatory Visit: Payer: BC Managed Care – PPO | Admitting: Obstetrics & Gynecology

## 2022-02-18 ENCOUNTER — Other Ambulatory Visit: Payer: Self-pay

## 2022-02-18 ENCOUNTER — Encounter: Payer: Self-pay | Admitting: Obstetrics & Gynecology

## 2022-02-18 VITALS — BP 132/72 | HR 77 | Ht 63.5 in | Wt 173.0 lb

## 2022-02-18 DIAGNOSIS — M810 Age-related osteoporosis without current pathological fracture: Secondary | ICD-10-CM | POA: Diagnosis not present

## 2022-02-18 DIAGNOSIS — Z780 Asymptomatic menopausal state: Secondary | ICD-10-CM

## 2022-02-18 NOTE — Progress Notes (Signed)
? ? ?  Kaylee Carter 1961/11/06 [id number] ? ? ?     61 y.o.  G1P0001  ? ?RP: Counseling and management of Osteoporosis ? ?HPI: Had her first BD.  No increase in risks of fall.  Postmenopausal for many years, on no systemic HRT.  Mother with Osteoporosis.  No other risk factor.  Good nutrition with Ca++ intake and Vit D supplement.  Physically active and increased her weight bearing activities significantly by getting a treadmill at work.  Now walking 4-5 hours everyday.  ? ? ?OB History  ?Gravida Para Term Preterm AB Living  ?1       0 1  ?SAB IAB Ectopic Multiple Live Births  ?0   0      ?  ?# Outcome Date GA Lbr Len/2nd Weight Sex Delivery Anes PTL Lv  ?1 Gravida           ? ? ?Past medical history,surgical history, problem list, medications, allergies, family history and social history were all reviewed and documented in the EPIC chart. ? ? ?Directed ROS with pertinent positives and negatives documented in the history of present illness/assessment and plan. ? ?Exam: ? ?Vitals:  ? 02/18/22 0800  ?BP: 132/72  ?Pulse: 77  ?SpO2: 99%  ?Weight: 173 lb (78.5 kg)  ?Height: 5' 3.5" (1.613 m)  ? ?General appearance:  Normal ? ?First Bone Density 01/08/2022:  Osteoporosis T-Score -2.8 at the AP Spine.  Other sites in Osteopenia range. ? ? ?Assessment/Plan:  61 y.o. G1P0001  ? ?1. Age-related osteoporosis without current pathological fracture ?Had her first BD.  No increase in risks of fall.  Postmenopausal for many years, on no systemic HRT.  Mother with Osteoporosis.  No other risk factor.  Good nutrition with Ca++ intake and Vit D supplement.  Physically active and increased her weight bearing activities significantly by getting a treadmill at work.  Now walking 4-5 hours everyday.   Recommend a total of 1.5 g/d of Ca++.  Counseling on the risks of fracture associated with Osteoporosis.  Bone medication discussed.  Patient declines at this time.  Will repeat a Bone Density at 2 years. ? ?2. Postmenopausal  ?Postmenopause,  well on no HRT. ? ?Counseling on risks of fractures associated with Osteoporosis and management of Osteoporosis for 30 minutes. ? ?Genia Del MD, 8:15 AM 02/18/2022 ? ? ? ?  ?

## 2022-05-02 DIAGNOSIS — L821 Other seborrheic keratosis: Secondary | ICD-10-CM | POA: Diagnosis not present

## 2022-05-02 DIAGNOSIS — L578 Other skin changes due to chronic exposure to nonionizing radiation: Secondary | ICD-10-CM | POA: Diagnosis not present

## 2022-05-02 DIAGNOSIS — L814 Other melanin hyperpigmentation: Secondary | ICD-10-CM | POA: Diagnosis not present

## 2022-05-02 DIAGNOSIS — D1801 Hemangioma of skin and subcutaneous tissue: Secondary | ICD-10-CM | POA: Diagnosis not present

## 2022-05-09 DIAGNOSIS — Z1231 Encounter for screening mammogram for malignant neoplasm of breast: Secondary | ICD-10-CM | POA: Diagnosis not present

## 2022-05-10 ENCOUNTER — Encounter: Payer: Self-pay | Admitting: Nurse Practitioner

## 2022-07-16 ENCOUNTER — Other Ambulatory Visit: Payer: Self-pay | Admitting: Family Medicine

## 2022-07-16 ENCOUNTER — Ambulatory Visit
Admission: RE | Admit: 2022-07-16 | Discharge: 2022-07-16 | Disposition: A | Discharge status: Home / Self Care | Payer: BC Managed Care – PPO | Source: Ambulatory Visit | Attending: Family Medicine | Admitting: Family Medicine

## 2022-07-16 DIAGNOSIS — R0781 Pleurodynia: Secondary | ICD-10-CM

## 2022-07-16 DIAGNOSIS — E559 Vitamin D deficiency, unspecified: Secondary | ICD-10-CM | POA: Diagnosis not present

## 2022-07-16 DIAGNOSIS — E039 Hypothyroidism, unspecified: Secondary | ICD-10-CM | POA: Diagnosis not present

## 2022-07-16 DIAGNOSIS — R0789 Other chest pain: Secondary | ICD-10-CM | POA: Diagnosis not present

## 2022-07-16 DIAGNOSIS — E7800 Pure hypercholesterolemia, unspecified: Secondary | ICD-10-CM | POA: Diagnosis not present

## 2022-07-16 DIAGNOSIS — L290 Pruritus ani: Secondary | ICD-10-CM | POA: Diagnosis not present

## 2022-07-16 DIAGNOSIS — I10 Essential (primary) hypertension: Secondary | ICD-10-CM | POA: Diagnosis not present

## 2022-09-02 DIAGNOSIS — K635 Polyp of colon: Secondary | ICD-10-CM | POA: Diagnosis not present

## 2022-09-02 DIAGNOSIS — K5730 Diverticulosis of large intestine without perforation or abscess without bleeding: Secondary | ICD-10-CM | POA: Diagnosis not present

## 2022-09-02 DIAGNOSIS — Z1211 Encounter for screening for malignant neoplasm of colon: Secondary | ICD-10-CM | POA: Diagnosis not present

## 2022-09-02 DIAGNOSIS — K644 Residual hemorrhoidal skin tags: Secondary | ICD-10-CM | POA: Diagnosis not present

## 2022-09-02 DIAGNOSIS — Z980 Intestinal bypass and anastomosis status: Secondary | ICD-10-CM | POA: Diagnosis not present

## 2022-09-02 DIAGNOSIS — K6289 Other specified diseases of anus and rectum: Secondary | ICD-10-CM | POA: Diagnosis not present

## 2022-09-24 DIAGNOSIS — Z23 Encounter for immunization: Secondary | ICD-10-CM | POA: Diagnosis not present

## 2022-10-03 ENCOUNTER — Ambulatory Visit (INDEPENDENT_AMBULATORY_CARE_PROVIDER_SITE_OTHER): Payer: Self-pay | Admitting: Physician Assistant

## 2022-10-03 ENCOUNTER — Encounter: Payer: Self-pay | Admitting: Physician Assistant

## 2022-10-03 VITALS — BP 130/80 | HR 87 | Temp 98.4°F | Ht 63.5 in | Wt 176.4 lb

## 2022-10-03 DIAGNOSIS — B9789 Other viral agents as the cause of diseases classified elsewhere: Secondary | ICD-10-CM

## 2022-10-03 DIAGNOSIS — J028 Acute pharyngitis due to other specified organisms: Secondary | ICD-10-CM

## 2022-10-03 NOTE — Progress Notes (Signed)
[facility] [address], [place] [address]   Office Visit Note  Patient Name: Kaylee Carter Date of Birth [id number]  Medical Record number [id number]  Date of Service: 10/03/2022  Chief Complaint  Patient presents with   Sore Throat    ST started this morning.      61 y/o F presents to the clinic for sore throat x earlier today. She denies fever, CP, or SOB. Denies known exposure to strep, flu or covid. Took Tylenol earlier today.   Sore Throat  Pertinent negatives include no congestion or trouble swallowing.      Current Medication:  Outpatient Encounter Medications as of 10/03/2022  Medication Sig   cholecalciferol (VITAMIN D3) 25 MCG (1000 UNIT) tablet Take 3,000 Units by mouth daily.   hydrochlorothiazide (HYDRODIURIL) 12.5 MG tablet Take 12.5 mg by mouth daily.   levothyroxine (SYNTHROID, LEVOTHROID) 100 MCG tablet Take 100 mcg by mouth daily.   losartan (COZAAR) 50 MG tablet Take 50 mg by mouth daily.   MAGNESIUM OXIDE PO Take 1 tablet by mouth at bedtime.   Multiple Vitamin (MULTIVITAMIN WITH MINERALS) TABS Take 1 tablet by mouth daily.    Omega-3 Fatty Acids (FISH OIL PO) Take 1-3 capsules by mouth daily.   clobetasol ointment (TEMOVATE) 1.96 % Apply 1 application topically 2 (two) times a week. Initial dose: Nightly x 2 weeks, then every other night x 2 weeks, then twice weekly (Patient not taking: Reported on 10/03/2022)   ibuprofen (ADVIL,MOTRIN) 200 MG tablet Take 400 mg by mouth every 6 (six) hours as needed for fever or mild pain. For pain   melatonin 1 MG TABS tablet Take 1 mg by mouth at bedtime. (Patient not taking: Reported on 10/03/2022)   No facility-administered encounter medications on file as of 10/03/2022.      Medical History: Past Medical History:  Diagnosis Date   Diverticulitis    Hypertension    Thyroid disease      Vital Signs: BP 130/80 (BP Location: Left Arm, Patient Position: Sitting, Cuff Size: Normal)    Pulse 87   Temp 98.4 F (36.9 C) (Tympanic)   Ht 5' 3.5" (1.613 m)   Wt 176 lb 6.4 oz (80 kg)   SpO2 97%   BMI 30.76 kg/m    Review of Systems  Constitutional: Negative.   HENT:  Positive for sore throat. Negative for congestion, postnasal drip, sinus pressure, sinus pain, sneezing and trouble swallowing.   Respiratory: Negative.    Cardiovascular: Negative.   Neurological: Negative.     Physical Exam Constitutional:      Appearance: Normal appearance.  HENT:     Head: Atraumatic.     Right Ear: Tympanic membrane, ear canal and external ear normal.     Left Ear: Tympanic membrane, ear canal and external ear normal.     Nose: Nose normal.     Mouth/Throat:     Mouth: Mucous membranes are moist.     Pharynx: Oropharynx is clear.     Tonsils: No tonsillar exudate. 0 on the right. 0 on the left.  Eyes:     Extraocular Movements: Extraocular movements intact.  Cardiovascular:     Rate and Rhythm: Normal rate and regular rhythm.  Pulmonary:     Effort: Pulmonary effort is normal.     Breath sounds: Normal breath sounds.  Musculoskeletal:     Cervical back: Neck supple.  Skin:    General: Skin is warm.  Neurological:  Mental Status: She is alert.  Psychiatric:        Mood and Affect: Mood normal.        Behavior: Behavior normal.        Thought Content: Thought content normal.        Judgment: Judgment normal.       Assessment/Plan:  1. Viral sore throat  Increase fluids Take otc throat logenzes to help soothe the throat. May drink warm liquids or soft ice cream to soothe the throat. Watch for worsening symptoms.  RTC prn. Pt verbalized understanding and in agreement.    General Counseling: Finna verbalizes understanding of the findings of todays visit and agrees with plan of treatment. I have discussed any further diagnostic evaluation that may be needed or ordered today. We also reviewed her medications today. she has been encouraged to call the office  with any questions or concerns that should arise related to todays visit.    Time spent:20 [place], [place] Physician Assistant

## 2022-11-19 DIAGNOSIS — H26491 Other secondary cataract, right eye: Secondary | ICD-10-CM | POA: Diagnosis not present

## 2022-11-19 DIAGNOSIS — Z961 Presence of intraocular lens: Secondary | ICD-10-CM | POA: Diagnosis not present

## 2022-11-19 DIAGNOSIS — H26493 Other secondary cataract, bilateral: Secondary | ICD-10-CM | POA: Diagnosis not present

## 2022-11-25 ENCOUNTER — Encounter: Payer: Self-pay | Admitting: Nurse Practitioner

## 2022-11-26 NOTE — Progress Notes (Unsigned)
Lydie Heinbaugh 01/07/1961 [id number]   History:  61 y.o. G1P1+1 adopted presents for annual exam. Postmenopausal - no HRT, no bleeding. Using Clobetasol twice weekly for lichen sclerosus with good management. Normal pap and mammogram history. Osteoporosis, declines medication management and wants to optimize Calcium, Vit D and exercise. Hypothyroidism, HTN, HLD managed by PCP.   Gynecologic History No LMP recorded. Patient is postmenopausal.   Contraception: post menopausal status and tubal ligation Sexually active:Yes  Health maintenance Last Pap: 10/05/2020. Results were: Normal, 5-year repeat Last mammogram: 05/10/2022. Results were: Normal Last colonoscopy: 08/2022. Results were: Benign polyp, 10-year recall Last Dexa: 01/08/2022. Results were: T-score -2.8 at spine, all other sites osteopenic  Past medical history, past surgical history, family history and social history were all reviewed and documented in the EPIC chart. Boyfriend. Working at [employer], retiring in January. Son in [place], daughter up [place] caring for father who has cancer. Sister with history of breast cancer. Father and sister with diabetes.   ROS:  A ROS was performed and pertinent positives and negatives are included.  Exam:  Vitals:   11/27/22 0804  BP: 122/84  Pulse: 93  SpO2: 96%  Weight: 180 lb (81.6 kg)  Height: 5' 3.5" (1.613 m)     Body mass index is 31.39 kg/m.  General appearance:  Normal Thyroid:  Symmetrical, normal in size, without palpable masses or nodularity. Respiratory  Auscultation:  Clear without wheezing or rhonchi Cardiovascular  Auscultation:  Regular rate, without rubs, murmurs or gallops  Edema/varicosities:  Not grossly evident Abdominal  Soft,nontender, without masses, guarding or rebound.  Liver/spleen:  No organomegaly noted  Hernia:  None appreciated  Skin  Inspection:  Grossly normal   Breasts: Examined lying and sitting.   Right: Without masses,  retractions, discharge or axillary adenopathy.   Left: Without masses, retractions, discharge or axillary adenopathy. Genitourinary   Inguinal/mons:  Normal without inguinal adenopathy  External genitalia:  Normal appearing vulva with no masses, tenderness, or lesions  BUS/Urethra/Skene's glands:  Normal  Vagina:  Normal appearing with normal color and discharge, no lesions  Cervix:  Normal appearing without discharge or lesions  Uterus:  Normal in size, shape and contour.  Midline and mobile, nontender  Adnexa/parametria:     Rt: Normal in size, without masses or tenderness.   Lt: Normal in size, without masses or tenderness.  Anus and perineum: Normal  Digital rectal exam: Deferred   Patient informed chaperone available to be present for breast and pelvic exam. Patient has requested no chaperone to be present. Patient has been advised what will be completed during breast and pelvic exam.   Assessment/Plan:  61 y.o. G1P1+1 adopted here for annual exam.   Well female exam with routine gynecological exam - Education provided on SBEs, importance of preventative screenings, current guidelines, high calcium diet, regular exercise, and multivitamin daily. Labs with PCP.   Postmenopausal - No HRT, no bleeding  Lichen sclerosus - Plan: clobetasol ointment (TEMOVATE) 0.05 % twice weekly with good management.   Age-related osteoporosis without current pathological fracture - T-score -2.8 at spine, all other sites osteopenic. Declines medication management and is optimizing Vit D + calcium and regular exercise. Will repeat at 2 years for recommendation.   Screening for cervical cancer - Normal Pap history. Will repeat at 5-year interval per guidelines.   Screening for breast cancer - Normal mammogram history. Normal breast exam today.  Continue annual screenings. Sister diagnosed with breast cancer in 2020.  Screening for colon cancer -  2023 colonoscopy. Will repeat at 10-year interval per  guidelines.    Follow-up in 1 year for annual.     [name] [facility], 8:20 AM 11/27/2022

## 2022-11-27 ENCOUNTER — Encounter: Payer: Self-pay | Admitting: Nurse Practitioner

## 2022-11-27 ENCOUNTER — Ambulatory Visit (INDEPENDENT_AMBULATORY_CARE_PROVIDER_SITE_OTHER): Payer: BC Managed Care – PPO | Admitting: Nurse Practitioner

## 2022-11-27 VITALS — BP 122/84 | HR 93 | Ht 63.5 in | Wt 180.0 lb

## 2022-11-27 DIAGNOSIS — Z01419 Encounter for gynecological examination (general) (routine) without abnormal findings: Secondary | ICD-10-CM | POA: Diagnosis not present

## 2022-11-27 DIAGNOSIS — L900 Lichen sclerosus et atrophicus: Secondary | ICD-10-CM

## 2022-11-27 DIAGNOSIS — M810 Age-related osteoporosis without current pathological fracture: Secondary | ICD-10-CM | POA: Diagnosis not present

## 2022-11-27 DIAGNOSIS — Z780 Asymptomatic menopausal state: Secondary | ICD-10-CM

## 2022-12-18 DIAGNOSIS — I10 Essential (primary) hypertension: Secondary | ICD-10-CM | POA: Diagnosis not present

## 2022-12-18 DIAGNOSIS — E039 Hypothyroidism, unspecified: Secondary | ICD-10-CM | POA: Diagnosis not present

## 2022-12-18 DIAGNOSIS — E7800 Pure hypercholesterolemia, unspecified: Secondary | ICD-10-CM | POA: Diagnosis not present

## 2022-12-18 DIAGNOSIS — E559 Vitamin D deficiency, unspecified: Secondary | ICD-10-CM | POA: Diagnosis not present

## 2022-12-18 DIAGNOSIS — L290 Pruritus ani: Secondary | ICD-10-CM | POA: Diagnosis not present

## 2023-01-03 DIAGNOSIS — H26493 Other secondary cataract, bilateral: Secondary | ICD-10-CM | POA: Diagnosis not present

## 2023-01-03 DIAGNOSIS — Z961 Presence of intraocular lens: Secondary | ICD-10-CM | POA: Diagnosis not present

## 2023-08-15 ENCOUNTER — Encounter: Payer: Self-pay | Admitting: Nurse Practitioner

## 2024-12-13 LAB — HM MAMMOGRAPHY
